# Patient Record
Sex: Female | Born: 1980 | Race: White | Hispanic: No | Marital: Married | Smoking: Former smoker
Health system: Southern US, Community
[De-identification: ages and names within clinical notes are randomized; demographics above are authoritative.]

## PROBLEM LIST (undated history)

## (undated) ENCOUNTER — Inpatient Hospital Stay: Payer: Self-pay

## (undated) DIAGNOSIS — N393 Stress incontinence (female) (male): Secondary | ICD-10-CM

## (undated) DIAGNOSIS — Z4689 Encounter for fitting and adjustment of other specified devices: Secondary | ICD-10-CM

## (undated) DIAGNOSIS — I10 Essential (primary) hypertension: Secondary | ICD-10-CM

## (undated) DIAGNOSIS — N8110 Cystocele, unspecified: Secondary | ICD-10-CM

## (undated) DIAGNOSIS — O1490 Unspecified pre-eclampsia, unspecified trimester: Secondary | ICD-10-CM

## (undated) HISTORY — DX: Encounter for fitting and adjustment of other specified devices: Z46.89

## (undated) HISTORY — PX: WISDOM TOOTH EXTRACTION: SHX21

## (undated) HISTORY — PX: FRACTURE SURGERY: SHX138

---
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
## 2005-12-17 ENCOUNTER — Observation Stay: Payer: Self-pay | Admitting: Obstetrics and Gynecology

## 2006-01-27 ENCOUNTER — Observation Stay: Payer: Self-pay | Admitting: Obstetrics and Gynecology

## 2006-02-04 ENCOUNTER — Observation Stay: Payer: Self-pay | Admitting: Obstetrics and Gynecology

## 2006-02-07 ENCOUNTER — Observation Stay: Payer: Self-pay | Admitting: Obstetrics and Gynecology

## 2006-02-10 ENCOUNTER — Observation Stay: Payer: Self-pay | Admitting: Obstetrics and Gynecology

## 2006-02-14 ENCOUNTER — Observation Stay: Payer: Self-pay | Admitting: Obstetrics and Gynecology

## 2006-02-17 ENCOUNTER — Observation Stay: Payer: Self-pay | Admitting: Obstetrics and Gynecology

## 2006-02-21 ENCOUNTER — Observation Stay: Payer: Self-pay | Admitting: Obstetrics and Gynecology

## 2006-02-23 ENCOUNTER — Inpatient Hospital Stay: Payer: Self-pay | Admitting: Obstetrics and Gynecology

## 2006-08-04 ENCOUNTER — Ambulatory Visit: Payer: Self-pay | Admitting: Allergy and Immunology

## 2008-01-19 ENCOUNTER — Emergency Department: Payer: Self-pay | Admitting: Emergency Medicine

## 2008-11-30 ENCOUNTER — Ambulatory Visit: Payer: Self-pay | Admitting: Family Medicine

## 2017-11-22 LAB — OB RESULTS CONSOLE ABO/RH: "RH Type ": POSITIVE

## 2017-12-06 NOTE — L&D Delivery Note (Signed)
       Delivery Note   Karen Lance BoschBrook Mosley is a 37 y.o. G2P0101 at 6278w2d Estimated Date of Delivery: 07/01/18  PRE-OPERATIVE DIAGNOSIS:  1) 4578w2d pregnancy.   POST-OPERATIVE DIAGNOSIS:  1) 7978w2d pregnancy s/p Vaginal, Spontaneous   Delivery Type: Vaginal, Spontaneous    Delivery Anesthesia: Epidural   Labor Complications:   None    ESTIMATED BLOOD LOSS: 50  ml    FINDINGS:   1) female infant, Apgar scores of 8    at 1 minute and 9    at 5 minutes and a birthweight of 98.77  ounces.    2) Nuchal cord: No  SPECIMENS:   PLACENTA:   Appearance: Intact    Removal: Spontaneous      Disposition:     DISPOSITION:  Infant to left in stable condition in the delivery room, with L&D personnel and mother,  NARRATIVE SUMMARY: Labor course:  [name] is a G2P0101 at 7278w2d who presented for induction of labor.  She progressed well in labor without pitocin.  She received the appropriate anesthesia and proceeded to complete dilation. She evidenced good maternal expulsive effort during the second stage. She went on to deliver a viable infant. The placenta delivered without problems and was noted to be complete. A perineal and vaginal examination was performed. Episiotomy/Lacerations: None    Elonda Huskyavid J. Evans, M.D. 06/12/2018 12:27 PM

## 2017-12-20 ENCOUNTER — Encounter: Payer: Self-pay | Admitting: Obstetrics and Gynecology

## 2017-12-20 ENCOUNTER — Emergency Department
Admission: EM | Admit: 2017-12-20 | Discharge: 2017-12-20 | Disposition: A | Discharge status: Home / Self Care | Payer: BC Managed Care – PPO | Attending: Student in an Organized Health Care Education/Training Program | Admitting: Student in an Organized Health Care Education/Training Program

## 2017-12-20 ENCOUNTER — Encounter: Payer: Self-pay | Admitting: *Deleted

## 2017-12-20 ENCOUNTER — Other Ambulatory Visit: Payer: Self-pay

## 2017-12-20 DIAGNOSIS — Z8759 Personal history of other complications of pregnancy, childbirth and the puerperium: Secondary | ICD-10-CM | POA: Diagnosis not present

## 2017-12-20 DIAGNOSIS — O132 Gestational [pregnancy-induced] hypertension without significant proteinuria, second trimester: Secondary | ICD-10-CM

## 2017-12-20 DIAGNOSIS — R55 Syncope and collapse: Secondary | ICD-10-CM | POA: Diagnosis not present

## 2017-12-20 DIAGNOSIS — Z3A13 13 weeks gestation of pregnancy: Secondary | ICD-10-CM | POA: Insufficient documentation

## 2017-12-20 DIAGNOSIS — O26892 Other specified pregnancy related conditions, second trimester: Secondary | ICD-10-CM | POA: Diagnosis not present

## 2017-12-20 HISTORY — DX: Unspecified pre-eclampsia, unspecified trimester: O14.90

## 2017-12-20 HISTORY — DX: Essential (primary) hypertension: I10

## 2017-12-20 LAB — BASIC METABOLIC PANEL
Anion gap: 9 (ref 5–15)
BUN: 17 mg/dL (ref 6–20)
CALCIUM: 9.1 mg/dL (ref 8.9–10.3)
CO2: 23 mmol/L (ref 22–32)
Chloride: 105 mmol/L (ref 101–111)
Creatinine, Ser: 0.66 mg/dL (ref 0.44–1.00)
GFR calc Af Amer: >60 mL/min (ref >60)
GLUCOSE: 96 mg/dL (ref 65–99)
Potassium: 4 mmol/L (ref 3.5–5.1)
Sodium: 137 mmol/L (ref 135–145)

## 2017-12-20 LAB — URINALYSIS, COMPLETE (UACMP) WITH MICROSCOPIC
Bacteria, UA: NONE SEEN
Bilirubin Urine: NEGATIVE
GLUCOSE, UA: NEGATIVE mg/dL
HGB URINE DIPSTICK: NEGATIVE
Ketones, ur: NEGATIVE mg/dL
Leukocytes, UA: NEGATIVE
Nitrite: NEGATIVE
Protein, ur: NEGATIVE mg/dL
RBC / HPF: NONE SEEN RBC/hpf (ref 0–5)
SPECIFIC GRAVITY, URINE: 1.02 (ref 1.005–1.030)
Squamous Epithelial / LPF: NONE SEEN
WBC, UA: NONE SEEN WBC/hpf (ref 0–5)
pH: 6 (ref 5.0–8.0)

## 2017-12-20 LAB — CBC
HCT: 35.6 % (ref 35.0–47.0)
Hemoglobin: 12.6 g/dL (ref 12.0–16.0)
MCH: 31.3 pg (ref 26.0–34.0)
MCHC: 35.5 g/dL (ref 32.0–36.0)
MCV: 88.2 fL (ref 80.0–100.0)
Platelets: 189 10*3/uL (ref 150–440)
RBC: 4.04 MIL/uL (ref 3.80–5.20)
RDW: 13.6 % (ref 11.5–14.5)
WBC: 8.3 10*3/uL (ref 3.6–11.0)

## 2017-12-20 LAB — HCG, QUANTITATIVE, PREGNANCY: HCG, BETA CHAIN, QUANT, S: 148227 m[IU]/mL — AB (ref <5)

## 2017-12-20 LAB — POCT PREGNANCY, URINE: Preg Test, Ur: POSITIVE — AB

## 2017-12-20 MED ORDER — NIFEDIPINE ER OSMOTIC RELEASE 30 MG PO TB24: 30.0000 mg | ORAL_TABLET | Freq: Every day | ORAL | 0 refills | Status: DC

## 2017-12-20 MED ORDER — NIFEDIPINE ER 30 MG PO TB24
30.0000 mg | ORAL_TABLET | Freq: Once | ORAL | Status: DC
  Filled 2017-12-20: qty 1

## 2017-12-20 NOTE — ED Triage Notes (Addendum)
Pt reports she had a syncopal; episode today while standing at the sink. Pt took vitals and reports they were WNL and went to work. PT reports while at work pt had a near syncopal episode again. Pt reports she has been feeling like her heart is racing intermittently. Pt denies feeling dizzy or symptomatic but reports headaches and feeling warm in her cheeks today. Pt is red in the cheeks.   Pt is [redacted] weeks pregnant. Pt reports with her first pregnancy she was preeclamptic. No troubles reported during this pregnancy. No vaginal bleeding or discharge. No abd cramping reporting.

## 2017-12-20 NOTE — ED Provider Notes (Signed)
[facility] Emergency Department Provider Note    First MD Initiated Contact with Patient 12/20/17 1958     (approximate)  I have reviewed the triage vital signs and the nursing notes.   HISTORY  Chief Complaint Loss of Consciousness    HPI Karen Mosley is a 37 y.o. female who is roughly [redacted] weeks pregnant G2P1 who presents with lightheadedness and near syncope that occurred while the patient was standing to get together today.  States that she started feeling lightheaded with blurry vision and fell but was able to catch her self.  Do not completely lose consciousness.  Denied any chest pain but has had palpitations.  Patient checked her blood pressure and it was elevated to the 140s twice today.  Has a history of preeclampsia so she went to be evaluated.  Denies any vaginal spotting.  No abdominal pain.  No chest pain or shortness of breath.  No lower extremity swelling.  Past Medical History:  Diagnosis Date  . Hypertension   . Preeclampsia    History reviewed. No pertinent family history. History reviewed. No pertinent surgical history. There are no active problems to display for this patient.     Prior to Admission medications   Medication Sig Start Date End Date Taking? Authorizing Provider  NIFEdipine (PROCARDIA-XL/ADALAT-CC/NIFEDICAL-XL) 30 MG 24 hr tablet Take 1 tablet (30 mg total) by mouth daily. 12/20/17 12/20/18  Willy Eddy, MD    Allergies Lobster [shellfish allergy]; Benadryl allergy [diphenhydramine hcl]; and Red dye    Social History Social History   Tobacco Use  . Smoking status: Never Smoker  . Smokeless tobacco: Never Used  Substance Use Topics  . Alcohol use: No    Frequency: Never  . Drug use: Not on file    Review of Systems Patient denies headaches, rhinorrhea, blurry vision, numbness, shortness of breath, chest pain, edema, cough, abdominal pain, nausea, vomiting, diarrhea, dysuria, fevers, rashes or  hallucinations unless otherwise stated above in HPI. ____________________________________________   PHYSICAL EXAM:  VITAL SIGNS: Vitals:   12/20/17 1658 12/20/17 2121  BP: (!) 141/78 109/70  Pulse: 76 69  Resp: 16 18  Temp: 98.4 F (36.9 C)   SpO2: 100% 100%    Constitutional: Alert and oriented. Well appearing and in no acute distress. Eyes: Conjunctivae are normal.  Head: Atraumatic. Nose: No congestion/rhinnorhea. Mouth/Throat: Mucous membranes are moist.   Neck: No stridor. Painless ROM.  Cardiovascular: Normal rate, regular rhythm. Grossly normal heart sounds.  Good peripheral circulation. Respiratory: Normal respiratory effort.  No retractions. Lungs CTAB. Gastrointestinal: Soft and nontender. No distention. No abdominal bruits. No CVA tenderness. Genitourinary:  Musculoskeletal: No lower extremity tenderness nor edema.  No joint effusions. Neurologic:  Normal speech and language. No gross focal neurologic deficits are appreciated. No facial droop Skin:  Skin is warm, dry and intact. No rash noted. Psychiatric: Mood and affect are normal. Speech and behavior are normal.  ____________________________________________   LABS (all labs ordered are listed, but only abnormal results are displayed)  Results for orders placed or performed during the hospital encounter of 12/20/17 (from the past 24 hour(s))  Basic metabolic panel     Status: None   Collection Time: 12/20/17  5:00 PM  Result Value Ref Range   Sodium 137 135 - 145 mmol/L   Potassium 4.0 3.5 - 5.1 mmol/L   Chloride 105 101 - 111 mmol/L   CO2 23 22 - 32 mmol/L   Glucose, Bld 96 65 - 99  mg/dL   BUN 17 6 - 20 mg/dL   Creatinine, Ser 1.610.66 0.44 - 1.00 mg/dL   Calcium 9.1 8.9 - 09.610.3 mg/dL   GFR calc non Af Amer >60 >60 mL/min   GFR calc Af Amer >60 >60 mL/min   Anion gap 9 5 - 15  CBC     Status: None   Collection Time: 12/20/17  5:00 PM  Result Value Ref Range   WBC 8.3 3.6 - 11.0 K/uL   RBC 4.04 3.80 -  5.20 MIL/uL   Hemoglobin 12.6 12.0 - 16.0 g/dL   HCT 04.535.6 40.935.0 - 81.147.0 %   MCV 88.2 80.0 - 100.0 fL   MCH 31.3 26.0 - 34.0 pg   MCHC 35.5 32.0 - 36.0 g/dL   RDW 91.413.6 78.211.5 - 95.614.5 %   Platelets 189 150 - 440 K/uL  hCG, quantitative, pregnancy     Status: Abnormal   Collection Time: 12/20/17  5:02 PM  Result Value Ref Range   hCG, Beta Chain, Quant, S 148,227 (H) <5 mIU/mL  Urinalysis, Complete w Microscopic     Status: Abnormal   Collection Time: 12/20/17  7:42 PM  Result Value Ref Range   Color, Urine YELLOW (A) YELLOW   APPearance TURBID (A) CLEAR   Specific Gravity, Urine 1.020 1.005 - 1.030   pH 6.0 5.0 - 8.0   Glucose, UA NEGATIVE NEGATIVE mg/dL   Hgb urine dipstick NEGATIVE NEGATIVE   Bilirubin Urine NEGATIVE NEGATIVE   Ketones, ur NEGATIVE NEGATIVE mg/dL   Protein, ur NEGATIVE NEGATIVE mg/dL   Nitrite NEGATIVE NEGATIVE   Leukocytes, UA NEGATIVE NEGATIVE   RBC / HPF NONE SEEN 0 - 5 RBC/hpf   WBC, UA NONE SEEN 0 - 5 WBC/hpf   Bacteria, UA NONE SEEN NONE SEEN   Squamous Epithelial / LPF NONE SEEN NONE SEEN   Amorphous Karen Mosley PRESENT   Pregnancy, urine POC     Status: Abnormal   Collection Time: 12/20/17  7:51 PM  Result Value Ref Range   Preg Test, Ur POSITIVE (A) NEGATIVE   ____________________________________________  EKG My review and personal interpretation at Time: 16:59   Indication: near syncope  Rate: 75  Rhythm: sinus Axis: normal Other: normal intervals, no stemi, no brugada ____________________________________________  RADIOLOGY  I personally reviewed all radiographic images ordered to evaluate for the above acute complaints and reviewed radiology reports and findings.  These findings were personally discussed with the patient.  Please see medical record for radiology report.  EMERGENCY DEPARTMENT US PREGNANCY "Study: Limited Ultrasound of the Pelvis for Pregnancy"  INDICATIONS:Pregnancy(required) Multiple views of the uterus and pelvic cavity were  obtained in real-time with a multi-frequency probe.  APPROACH:Transabdominal  PERFORMED BY: Myself IMAGES ARCHIVED?: Yes LIMITATIONS: none PREGNANCY FREE FLUID: Present ADNEXAL FINDINGS: GESTATIONAL AGE, ESTIMATE: 13 FETAL HEART RATE: 158 INTERPRETATION: reassuring fetal movement     ____________________________________________   PROCEDURES  Procedure(s) performed:  Procedures    Critical Care performed: no ____________________________________________   INITIAL IMPRESSION / ASSESSMENT AND PLAN / ED COURSE  Pertinent labs & imaging results that were available during my care of the patient were reviewed by me and considered in my medical decision making (see chart for details).  DDX: Dysrhythmia, lightheadedness, orthostasis, UTI, ectopic, anemia  Karen Mosley is a 37 y.o. who presents to the ED with elevated blood pressure and above symptoms.  She is afebrile mildly hypertensive.  No proteinuria.  No evidence of UTI.  Abdominal exam soft and benign  with reassuring bedside ultrasound and fetal heart tones.  EKG shows no dysrhythmia.  Patient otherwise back to baseline with no symptoms.  No evidence of head injury.  I spoke with Dr. Gaynelle Arabian of OB/GYN regarding the patient's elevated blood pressure and history of preeclampsia she has recommended initiating Procardia 30 mg as well as a baby aspirin at night to reduce risk of preeclampsia.  Patient will follow-up with OB/GYN.  Have discussed with the patient and available family all diagnostics and treatments performed thus far and all questions were answered to the best of my ability. The patient demonstrates understanding and agreement with plan.       ____________________________________________   FINAL CLINICAL IMPRESSION(S) / ED DIAGNOSES  Final diagnoses:  Near syncope  Gestational hypertension, second trimester      NEW MEDICATIONS STARTED DURING THIS VISIT:  Discharge Medication List as of 12/20/2017  9:18  PM    START taking these medications   Details  NIFEdipine (PROCARDIA-XL/ADALAT-CC/NIFEDICAL-XL) 30 MG 24 hr tablet Take 1 tablet (30 mg total) by mouth daily., Starting Tue 12/20/2017, Until Wed 12/20/2018, Print         Note:  This document was prepared using Dragon voice recognition software and may include unintentional dictation errors.    Willy Eddy, MD 12/21/17 [phone]

## 2017-12-20 NOTE — ED Notes (Signed)
UNABLE TO HEAR FETAL HEART TONES IN TRIAGE 

## 2018-01-04 ENCOUNTER — Telehealth: Payer: Self-pay | Admitting: *Deleted

## 2018-01-04 NOTE — Telephone Encounter (Signed)
Patient called and states that she is coming to see Dr. Valentino Saxonherry for a NEW OB appt on 01/11/18 . Patient is wanting a RX sent to her pharmacy for folic acid . Patient states she takes 4mg  of that. Patient is unsure if Dr. Valentino Saxonherry could send that for her. [facility] [place]. Please advise. Thank you

## 2018-01-06 ENCOUNTER — Encounter: Payer: Self-pay | Admitting: Obstetrics and Gynecology

## 2018-01-11 ENCOUNTER — Encounter: Payer: Self-pay | Admitting: Obstetrics and Gynecology

## 2018-01-11 ENCOUNTER — Ambulatory Visit (INDEPENDENT_AMBULATORY_CARE_PROVIDER_SITE_OTHER): Payer: BC Managed Care – PPO | Admitting: Obstetrics and Gynecology

## 2018-01-11 VITALS — BP 108/72 | HR 85 | Wt 137.9 lb

## 2018-01-11 DIAGNOSIS — O10912 Unspecified pre-existing hypertension complicating pregnancy, second trimester: Secondary | ICD-10-CM

## 2018-01-11 DIAGNOSIS — Z1389 Encounter for screening for other disorder: Secondary | ICD-10-CM

## 2018-01-11 DIAGNOSIS — O10913 Unspecified pre-existing hypertension complicating pregnancy, third trimester: Secondary | ICD-10-CM | POA: Insufficient documentation

## 2018-01-11 DIAGNOSIS — O0992 Supervision of high risk pregnancy, unspecified, second trimester: Secondary | ICD-10-CM

## 2018-01-11 DIAGNOSIS — O0993 Supervision of high risk pregnancy, unspecified, third trimester: Secondary | ICD-10-CM | POA: Insufficient documentation

## 2018-01-11 DIAGNOSIS — O09292 Supervision of pregnancy with other poor reproductive or obstetric history, second trimester: Secondary | ICD-10-CM

## 2018-01-11 DIAGNOSIS — Z1379 Encounter for other screening for genetic and chromosomal anomalies: Secondary | ICD-10-CM

## 2018-01-11 DIAGNOSIS — Z113 Encounter for screening for infections with a predominantly sexual mode of transmission: Secondary | ICD-10-CM

## 2018-01-11 DIAGNOSIS — O09522 Supervision of elderly multigravida, second trimester: Secondary | ICD-10-CM

## 2018-01-11 LAB — OB RESULTS CONSOLE VARICELLA ZOSTER ANTIBODY, IGG: Varicella: IMMUNE

## 2018-01-11 MED ORDER — ASPIRIN EC 81 MG PO TBEC: 81.0000 mg | DELAYED_RELEASE_TABLET | Freq: Every day | ORAL | 2 refills | Status: DC

## 2018-01-11 NOTE — Progress Notes (Signed)
OBSTETRIC INITIAL PRENATAL VISIT  Subjective:    Karen Mosley is being seen today for her first obstetrical visit.  [facility]. This is an unplanned but desired pregnancy. She is a G1P0 female at 7169w4d gestation, Estimated Date of Delivery: 07/01/18 by 8 week sono, LMP unknown. Her obstetrical history is significant for pre-eclampsia in prior pregnancy, now with cHTN, and preterm delivery. Relationship with FOB: spouse, living together. Patient does intend to breast feed. Pregnancy history fully reviewed.   Obstetric History   G2   P1   T0   P1   A0   L1    SAB0   TAB0   Ectopic0   Multiple0   Live Births1     # Outcome Date GA Lbr Len/2nd Weight Sex Delivery Anes PTL Lv  2 Current           1 Preterm 2006 1666w5d  6 lb (2.722 kg) M Vag-Vacuum  N LIV    Obstetric Comments  G1- pre-eclampsia, induced at ~ 34 weeks, with postpartum HTN, now chronic. Also with nuchal cord, vacuum assistance due to fetal distress. .     Gynecologic History:  Last pap smear was 2 years ago .  Results were normal.  Denies h/o abnormal pap smears in the past.  Denies history of STIs.    Past Medical History:  Diagnosis Date  . Hypertension   . Preeclampsia      Family History  Problem Relation Age of Onset  . Hypertension Mother   . Hypertension Father   . Hypertension Maternal Aunt   . Stroke Maternal Grandmother   . Breast cancer Maternal Grandmother        with recurrence  . Hypertension Maternal Grandmother      History reviewed. No pertinent surgical history.   Social History   Socioeconomic History  . Marital status: Married    Spouse name: Not on file  . Number of children: Not on file  . Years of education: Not on file  . Highest education level: Not on file  Social Needs  . Financial resource strain: Not on file  . Food insecurity - worry: Not on file  . Food insecurity - inability: Not on file  . Transportation needs - medical: Not on  file  . Transportation needs - non-medical: Not on file  Occupational History  . Not on file  Tobacco Use  . Smoking status: Never Smoker  . Smokeless tobacco: Never Used  Substance and Sexual Activity  . Alcohol use: No    Frequency: Never  . Drug use: Not on file  . Sexual activity: Yes  Other Topics Concern  . Not on file  Social History Narrative  . Not on file     Current Outpatient Medications on File Prior to Visit  Medication Sig Dispense Refill  . NIFEdipine (PROCARDIA-XL/ADALAT-CC/NIFEDICAL-XL) 30 MG 24 hr tablet Take 1 tablet (30 mg total) by mouth daily. 30 tablet 0  . Prenatal Vit-Fe Fumarate-FA (MULTIVITAMIN-PRENATAL) 27-0.8 MG TABS tablet Take 1 tablet by mouth daily at 12 noon.     No current facility-administered medications on file prior to visit.      Allergies  Allergen Reactions  . Lobster [Shellfish Allergy] Anaphylaxis  . Benadryl Allergy [Diphenhydramine Hcl] Swelling  . Red Dye Swelling     Review of Systems General:Not Present- Fever, Weight Loss and Weight Gain. Skin:Not Present- Rash. HEENT:Not Present- Blurred Vision, Headache and Bleeding Gums. Respiratory:Not Present-  Difficulty Breathing. Breast:Not Present- Breast Mass. Cardiovascular:Not Present- Chest Pain, Elevated Blood Pressure, Fainting / Blacking Out and Shortness of Breath. Gastrointestinal:Not Present- Abdominal Pain, Constipation, Nausea and Vomiting. Female Genitourinary:Not Present- Frequency, Painful Urination, Pelvic Pain, Vaginal Bleeding, Vaginal Discharge, Contractions, regular, Fetal Movements Decreased, Urinary Complaints and Vaginal Fluid. Musculoskeletal:Not Present- Back Pain and Leg Cramps. Neurological:Not Present- Dizziness. Psychiatric:Not Present- Depression.     Objective:   Blood pressure 108/72, pulse 85, weight 137 lb 14.4 oz (62.6 kg).  Body mass index is 25.22 kg/m.   General Appearance:    Alert, cooperative, no distress, appears  stated age  Head:    Normocephalic, without obvious abnormality, atraumatic  Eyes:    PERRL, conjunctiva/corneas clear, EOM's intact, both eyes  Ears:    Normal external ear canals, both ears  Nose:   Nares normal, septum midline, mucosa normal, no drainage or sinus tenderness  Throat:   Lips, mucosa, and tongue normal; teeth and gums normal  Neck:   Supple, symmetrical, trachea midline, no adenopathy; thyroid: no enlargement/tenderness/nodules; no carotid bruit or JVD  Back:     Symmetric, no curvature, ROM normal, no CVA tenderness  Lungs:     Clear to auscultation bilaterally, respirations unlabored  Chest Wall:    No tenderness or deformity   Heart:    Regular rate and rhythm, S1 and S2 normal, no murmur, rub or gallop  Breast Exam:    No tenderness, masses, or nipple abnormality  Abdomen:     Soft, non-tender, bowel sounds active all four quadrants, no masses, no organomegaly.  FH 15.  FHT 153 bpm.  Genitalia:    Pelvic:external genitalia normal, vagina without lesions, discharge, or tenderness, rectovaginal septum  normal. Cervix normal in appearance, no cervical motion tenderness, no adnexal masses or tenderness.  Pregnancy positive findings: uterine enlargement: 15 wk size, nontender.   Rectal:    Normal external sphincter.  No hemorrhoids appreciated. Internal exam not done.   Extremities:   Extremities normal, atraumatic, no cyanosis or edema  Pulses:   2+ and symmetric all extremities  Skin:   Skin color, texture, turgor normal, no rashes or lesions  Lymph nodes:   Cervical, supraclavicular, and axillary nodes normal  Neurologic:   CNII-XII intact, normal strength, sensation and reflexes throughout      Assessment:   Pregnancy at 15 and 4/7 weeks   Advanced maternal age H/o pre-eclampsia H/o preterm delivery x 1   Plan:    Initial labs ordered. Prenatal vitamins encouraged. Problem list reviewed and updated. New OB counseling:  The patient has been given an overview  regarding routine prenatal care.  Recommendations regarding diet, weight gain, and exercise in pregnancy were given. Prenatal testing, optional genetic testing, and ultrasound use in pregnancy were reviewed.  Patient initially declined genetic testing at [facility] office.  Discussed benefits of genetic testing, especially in light of AMA status. Patient has now reconsidered and will do Panorama. Also ordered msAFP.  Benefits of Breast Feeding were discussed. The patient is encouraged to consider nursing her baby post partum. CHTN - previously on [place] prior to pregnancy, changed to Procardia in January during a visit ER. Can continue current dosing. Will monitor BPs during pregnancy. Advised on beginning baby aspirin.  Also discussed need for antenatal testing and serial growth scans during third trimester. CBC/CMP reviewed from ER visit. Baseline PC ratio ordered.  H/o preterm delivery x 1, induction for pre-eclampsia. Not a candidate for 17-OHP.  Declines flu vaccine.  Follow up in  4 weeks.  For anatomy scan at that time.    50% of 30 min visit spent on counseling and coordination of care.   Hildred Laser, MD [facility]

## 2018-01-12 LAB — DRUG PROFILE, UR, 9 DRUGS (LABCORP)
Amphetamines, Urine: NEGATIVE ng/mL
BENZODIAZEPINE QUANT UR: NEGATIVE ng/mL
Barbiturate Quant, Ur: NEGATIVE ng/mL
CANNABINOID QUANT UR: NEGATIVE ng/mL
Cocaine (Metab.): NEGATIVE ng/mL
Methadone Screen, Urine: NEGATIVE ng/mL
Opiate Quant, Ur: NEGATIVE ng/mL
PCP QUANT UR: NEGATIVE ng/mL
Propoxyphene: NEGATIVE ng/mL

## 2018-01-12 LAB — URINALYSIS, ROUTINE W REFLEX MICROSCOPIC
Bilirubin, UA: NEGATIVE
GLUCOSE, UA: NEGATIVE
Ketones, UA: NEGATIVE
LEUKOCYTES UA: NEGATIVE
Nitrite, UA: NEGATIVE
PROTEIN UA: NEGATIVE
RBC, UA: NEGATIVE
Specific Gravity, UA: 1.018 (ref 1.005–1.030)
UUROB: 0.2 mg/dL (ref 0.2–1.0)
pH, UA: 6.5 (ref 5.0–7.5)

## 2018-01-12 LAB — RPR: RPR: NONREACTIVE

## 2018-01-12 LAB — CBC WITH DIFFERENTIAL/PLATELET
BASOS: 0 %
Basophils Absolute: 0 10*3/uL (ref 0.0–0.2)
EOS (ABSOLUTE): 0.1 10*3/uL (ref 0.0–0.4)
Eos: 1 %
Hematocrit: 35.9 % (ref 34.0–46.6)
Hemoglobin: 12.5 g/dL (ref 11.1–15.9)
IMMATURE GRANS (ABS): 0.1 10*3/uL (ref 0.0–0.1)
IMMATURE GRANULOCYTES: 1 %
LYMPHS: 23 %
Lymphocytes Absolute: 2 10*3/uL (ref 0.7–3.1)
MCH: 30.4 pg (ref 26.6–33.0)
MCHC: 34.8 g/dL (ref 31.5–35.7)
MCV: 87 fL (ref 79–97)
Monocytes Absolute: 0.5 10*3/uL (ref 0.1–0.9)
Monocytes: 6 %
NEUTROS PCT: 69 %
Neutrophils Absolute: 5.9 10*3/uL (ref 1.4–7.0)
PLATELETS: 213 10*3/uL (ref 150–379)
RBC: 4.11 x10E6/uL (ref 3.77–5.28)
RDW: 13.7 % (ref 12.3–15.4)
WBC: 8.6 10*3/uL (ref 3.4–10.8)

## 2018-01-12 LAB — ANTIBODY SCREEN: Antibody Screen: NEGATIVE

## 2018-01-12 LAB — HIV ANTIBODY (ROUTINE TESTING W REFLEX): HIV Screen 4th Generation wRfx: NONREACTIVE

## 2018-01-12 LAB — NICOTINE SCREEN, URINE: COTININE UR QL SCN: NEGATIVE ng/mL

## 2018-01-12 LAB — RUBELLA SCREEN: RUBELLA: 1.52 {index} (ref >0.99)

## 2018-01-12 LAB — VARICELLA ZOSTER ANTIBODY, IGG: VARICELLA: 851 {index} (ref >165)

## 2018-01-13 LAB — GC/CHLAMYDIA PROBE AMP
Chlamydia trachomatis, NAA: NEGATIVE
NEISSERIA GONORRHOEAE BY PCR: NEGATIVE

## 2018-01-13 LAB — CULTURE, OB URINE

## 2018-01-13 LAB — URINE CULTURE, OB REFLEX

## 2018-01-20 ENCOUNTER — Telehealth: Payer: Self-pay | Admitting: Obstetrics and Gynecology

## 2018-01-20 NOTE — Telephone Encounter (Signed)
Patient called stating she needs a refill on her bloood pressure meds sent to the [place] pharmacy. Thanks

## 2018-01-23 MED ORDER — NIFEDIPINE ER OSMOTIC RELEASE 30 MG PO TB24: 30.0000 mg | ORAL_TABLET | Freq: Every day | ORAL | 0 refills | Status: DC

## 2018-01-23 NOTE — Telephone Encounter (Signed)
Pt was called and informed that her medication Procardia had been refilled.

## 2018-01-24 ENCOUNTER — Telehealth: Payer: Self-pay | Admitting: *Deleted

## 2018-01-24 NOTE — Telephone Encounter (Signed)
Patient called and states she had genetic testing done on 01/11/18 and she is wondering if the results are back. Patient is requesting a call back. Her contact # is [phone]. Please advise. Thank you

## 2018-01-26 NOTE — Telephone Encounter (Signed)
Pt was called back no answer. Left message via voicemail that her genetic testing was still being processed and as soon as it arrived I would give her a call back and go over the results.

## 2018-01-31 ENCOUNTER — Telehealth: Payer: Self-pay | Admitting: Obstetrics and Gynecology

## 2018-01-31 NOTE — Telephone Encounter (Signed)
-----   Message from Hildred LaserAnika Cherry, MD sent at 01/26/2018  2:39 PM EST ----- Regarding: Panorama results Please inform patient of normal genetic testing. Is a female if she desires to know the sex.

## 2018-01-31 NOTE — Telephone Encounter (Signed)
Pt is aware of her results.  

## 2018-01-31 NOTE — Telephone Encounter (Signed)
Pt was left message via voicemail to call the office to get her results.

## 2018-01-31 NOTE — Telephone Encounter (Signed)
The patient called and stated that she would like to have her nurse call her back to go over her genetic test results. Please advise.

## 2018-02-08 ENCOUNTER — Ambulatory Visit (INDEPENDENT_AMBULATORY_CARE_PROVIDER_SITE_OTHER): Payer: BC Managed Care – PPO | Admitting: Obstetrics and Gynecology

## 2018-02-08 ENCOUNTER — Ambulatory Visit (INDEPENDENT_AMBULATORY_CARE_PROVIDER_SITE_OTHER): Payer: BC Managed Care – PPO

## 2018-02-08 ENCOUNTER — Encounter: Payer: Self-pay | Admitting: Obstetrics and Gynecology

## 2018-02-08 VITALS — BP 117/76 | HR 80 | Wt 142.9 lb

## 2018-02-08 DIAGNOSIS — Z3492 Encounter for supervision of normal pregnancy, unspecified, second trimester: Secondary | ICD-10-CM | POA: Diagnosis not present

## 2018-02-08 DIAGNOSIS — O09522 Supervision of elderly multigravida, second trimester: Secondary | ICD-10-CM

## 2018-02-08 DIAGNOSIS — O09292 Supervision of pregnancy with other poor reproductive or obstetric history, second trimester: Secondary | ICD-10-CM

## 2018-02-08 DIAGNOSIS — O0992 Supervision of high risk pregnancy, unspecified, second trimester: Secondary | ICD-10-CM | POA: Diagnosis not present

## 2018-02-08 LAB — POCT URINALYSIS DIPSTICK
BILIRUBIN UA: NEGATIVE
GLUCOSE UA: NEGATIVE
KETONES UA: NEGATIVE
Leukocytes, UA: NEGATIVE
Nitrite, UA: NEGATIVE
Protein, UA: NEGATIVE
RBC UA: NEGATIVE
SPEC GRAV UA: 1.015 (ref 1.010–1.025)
Urobilinogen, UA: 0.2 E.U./dL
pH, UA: 6 (ref 5.0–8.0)

## 2018-02-08 NOTE — Progress Notes (Signed)
ROB- anatomy scan done today, pt is doing well 

## 2018-02-08 NOTE — Progress Notes (Signed)
ROB:  U/S today - Nl anatomy.  Taking 81 mg aspirin daily.  Taking Procardia daily.  Desires AFP for spina bifida.

## 2018-02-09 LAB — PROTEIN / CREATININE RATIO, URINE
Creatinine, Urine: 104.6 mg/dL
PROTEIN/CREAT RATIO: 76 mg/g{creat} (ref 0–200)
Protein, Ur: 8 mg/dL

## 2018-02-10 LAB — AFP, SERUM, OPEN SPINA BIFIDA
AFP MOM: 1.36
AFP VALUE AFPOSL: 68.4 ng/mL
Gest. Age on Collection Date: 19 weeks
MATERNAL AGE AT EDD: 35.6 a
OSBR Risk 1 IN: 4034
Test Results:: NEGATIVE
Weight: 142 [lb_av]

## 2018-03-08 ENCOUNTER — Ambulatory Visit (INDEPENDENT_AMBULATORY_CARE_PROVIDER_SITE_OTHER): Payer: BC Managed Care – PPO | Admitting: Obstetrics and Gynecology

## 2018-03-08 ENCOUNTER — Encounter: Payer: Self-pay | Admitting: Obstetrics and Gynecology

## 2018-03-08 VITALS — BP 131/83 | HR 95 | Wt 151.4 lb

## 2018-03-08 DIAGNOSIS — Z131 Encounter for screening for diabetes mellitus: Secondary | ICD-10-CM

## 2018-03-08 DIAGNOSIS — O0992 Supervision of high risk pregnancy, unspecified, second trimester: Secondary | ICD-10-CM

## 2018-03-08 DIAGNOSIS — O10919 Unspecified pre-existing hypertension complicating pregnancy, unspecified trimester: Secondary | ICD-10-CM

## 2018-03-08 DIAGNOSIS — Z8759 Personal history of other complications of pregnancy, childbirth and the puerperium: Secondary | ICD-10-CM

## 2018-03-08 DIAGNOSIS — Z113 Encounter for screening for infections with a predominantly sexual mode of transmission: Secondary | ICD-10-CM

## 2018-03-08 DIAGNOSIS — Z130 Encounter for screening for diseases of the blood and blood-forming organs and certain disorders involving the immune mechanism: Secondary | ICD-10-CM

## 2018-03-08 LAB — POCT URINALYSIS DIPSTICK
BILIRUBIN UA: NEGATIVE
Blood, UA: NEGATIVE
GLUCOSE UA: NEGATIVE
KETONES UA: 5
LEUKOCYTES UA: NEGATIVE
Nitrite, UA: NEGATIVE
Spec Grav, UA: 1.02 (ref 1.010–1.025)
Urobilinogen, UA: 0.2 E.U./dL
pH, UA: 6.5 (ref 5.0–8.0)

## 2018-03-08 NOTE — Progress Notes (Signed)
ROB: Patient doing well, no complaints. RTC in 4 weeks. Will need 28 week labs then, and growth scan for cHTN in pregnancy. Discussion had regarding need for antenatal testing and serial growth scans at 32 weeks.  Will continue to monitor closely for s/s of pre-eclampsia as last pregnancy was complicated, and required early delivery at 34 weeks.

## 2018-03-08 NOTE — Progress Notes (Signed)
ROB pt is doing well no concerns.  

## 2018-04-11 ENCOUNTER — Encounter: Payer: Self-pay | Admitting: Obstetrics and Gynecology

## 2018-04-11 ENCOUNTER — Ambulatory Visit (INDEPENDENT_AMBULATORY_CARE_PROVIDER_SITE_OTHER): Payer: BC Managed Care – PPO | Admitting: Obstetrics and Gynecology

## 2018-04-11 ENCOUNTER — Encounter: Payer: BC Managed Care – PPO | Admitting: Obstetrics and Gynecology

## 2018-04-11 ENCOUNTER — Ambulatory Visit: Payer: BC Managed Care – PPO

## 2018-04-11 VITALS — BP 142/87 | HR 94 | Wt 153.3 lb

## 2018-04-11 DIAGNOSIS — O0993 Supervision of high risk pregnancy, unspecified, third trimester: Secondary | ICD-10-CM | POA: Diagnosis not present

## 2018-04-11 DIAGNOSIS — Z23 Encounter for immunization: Secondary | ICD-10-CM

## 2018-04-11 DIAGNOSIS — O0992 Supervision of high risk pregnancy, unspecified, second trimester: Secondary | ICD-10-CM

## 2018-04-11 DIAGNOSIS — O10919 Unspecified pre-existing hypertension complicating pregnancy, unspecified trimester: Secondary | ICD-10-CM

## 2018-04-11 LAB — POCT URINALYSIS DIPSTICK
Bilirubin, UA: NEGATIVE
Blood, UA: NEGATIVE
CLARITY UA: NEGATIVE
GLUCOSE UA: NEGATIVE
KETONES UA: NEGATIVE
Leukocytes, UA: NEGATIVE
Nitrite, UA: NEGATIVE
ODOR: NEGATIVE
SPEC GRAV UA: 1.015 (ref 1.010–1.025)
Urobilinogen, UA: 0.2 E.U./dL
pH, UA: 6.5 (ref 5.0–8.0)

## 2018-04-11 MED ORDER — TETANUS-DIPHTH-ACELL PERTUSSIS 5-2.5-18.5 LF-MCG/0.5 IM SUSP
0.5000 mL | Freq: Once | INTRAMUSCULAR | Status: AC
  Administered 2018-04-11: 0.5 mL via INTRAMUSCULAR

## 2018-04-11 NOTE — Progress Notes (Signed)
ROB: Hypertension slightly worse.  Patient taking 30 of Procardia.  Increased to 30 twice daily.  Patient will need to reschedule ultrasound and 1 hour GCT.  Recheck blood pressure in 2 weeks and should have results from ultrasound and GCT at that time.  Discussed Carpal tunnel syndrome in detail-advised use of neutral wrist splints.

## 2018-04-11 NOTE — Progress Notes (Signed)
ROB - tdap- done. Hands and fingers go numb at bedtime. Needs to r/s gct and growth scan.

## 2018-04-15 ENCOUNTER — Other Ambulatory Visit: Payer: Self-pay | Admitting: Obstetrics and Gynecology

## 2018-04-21 ENCOUNTER — Telehealth: Payer: Self-pay | Admitting: *Deleted

## 2018-04-21 NOTE — Telephone Encounter (Signed)
Patient called and stats she thinks she is having side effect from her blood pressure medication. Patient states she discussed this at her last appt. Dr states it may be carpal tunnel. Patient states the pain and numbness is now progressing to her shoulders.  She states she would like to change medications. Patient is requesting a call back. Her contact # is [phone]. Please advise. Thank you

## 2018-04-21 NOTE — Telephone Encounter (Signed)
lmtrc

## 2018-04-25 ENCOUNTER — Other Ambulatory Visit: Payer: Self-pay | Admitting: Obstetrics and Gynecology

## 2018-04-25 DIAGNOSIS — O163 Unspecified maternal hypertension, third trimester: Secondary | ICD-10-CM

## 2018-04-25 NOTE — Telephone Encounter (Signed)
lmtrc

## 2018-04-26 ENCOUNTER — Encounter: Payer: Self-pay | Admitting: Obstetrics and Gynecology

## 2018-04-26 ENCOUNTER — Ambulatory Visit (INDEPENDENT_AMBULATORY_CARE_PROVIDER_SITE_OTHER): Payer: BC Managed Care – PPO | Admitting: Obstetrics and Gynecology

## 2018-04-26 VITALS — BP 115/75 | HR 87 | Wt 156.0 lb

## 2018-04-26 DIAGNOSIS — O26899 Other specified pregnancy related conditions, unspecified trimester: Secondary | ICD-10-CM

## 2018-04-26 DIAGNOSIS — O0993 Supervision of high risk pregnancy, unspecified, third trimester: Secondary | ICD-10-CM

## 2018-04-26 DIAGNOSIS — O10913 Unspecified pre-existing hypertension complicating pregnancy, third trimester: Secondary | ICD-10-CM

## 2018-04-26 DIAGNOSIS — G5600 Carpal tunnel syndrome, unspecified upper limb: Secondary | ICD-10-CM

## 2018-04-26 DIAGNOSIS — O09893 Supervision of other high risk pregnancies, third trimester: Secondary | ICD-10-CM

## 2018-04-26 DIAGNOSIS — O09213 Supervision of pregnancy with history of pre-term labor, third trimester: Secondary | ICD-10-CM

## 2018-04-26 LAB — POCT URINALYSIS DIPSTICK
BILIRUBIN UA: NEGATIVE
Glucose, UA: NEGATIVE
Ketones, UA: NEGATIVE
Leukocytes, UA: NEGATIVE
NITRITE UA: NEGATIVE
Protein, UA: POSITIVE — AB
RBC UA: NEGATIVE
Spec Grav, UA: 1.015 (ref 1.010–1.025)
UROBILINOGEN UA: 0.2 U/dL
pH, UA: 6.5 (ref 5.0–8.0)

## 2018-04-26 NOTE — Progress Notes (Signed)
ROB: Patient continues to complain of carpal tunnel symptoms. Has tried ice, position changes, and old neural wrist splints. Wonders if it's also related to the Procardia (read side effects online). Discussed that it is possible, however it is more beneficial to remain on Procardia at this time for her HTN.  For glucola and growth scan tomorrow. RTC in 2 weeks. To begin twice weekly NSTs at 32 weeks.

## 2018-04-26 NOTE — Progress Notes (Signed)
ROB-pt stated that she has been having some pressure in the abd/pelvic area but not pain. Pt stated that her hands are going numb and having pain. Pt stated she is unable to hold something, burning herself,

## 2018-04-27 ENCOUNTER — Ambulatory Visit (INDEPENDENT_AMBULATORY_CARE_PROVIDER_SITE_OTHER): Payer: BC Managed Care – PPO

## 2018-04-27 ENCOUNTER — Ambulatory Visit: Payer: BC Managed Care – PPO

## 2018-04-27 DIAGNOSIS — O163 Unspecified maternal hypertension, third trimester: Secondary | ICD-10-CM

## 2018-04-27 NOTE — Telephone Encounter (Signed)
Seen by [facility] 04/26/2018.  Chart filed.

## 2018-04-27 NOTE — Addendum Note (Signed)
Addended by: Smith Mince on: 04/27/2018 09:44 AM   Modules accepted: Orders

## 2018-04-28 LAB — CBC
Hematocrit: 34.2 % (ref 34.0–46.6)
Hemoglobin: 11.7 g/dL (ref 11.1–15.9)
MCH: 29.3 pg (ref 26.6–33.0)
MCHC: 34.2 g/dL (ref 31.5–35.7)
MCV: 86 fL (ref 79–97)
Platelets: 187 10*3/uL (ref 150–450)
RBC: 3.99 x10E6/uL (ref 3.77–5.28)
RDW: 13.9 % (ref 12.3–15.4)
WBC: 7.8 10*3/uL (ref 3.4–10.8)

## 2018-04-28 LAB — GLUCOSE, 1 HOUR GESTATIONAL: Gestational Diabetes Screen: 78 mg/dL (ref 65–139)

## 2018-04-28 LAB — RPR: RPR: NONREACTIVE

## 2018-05-12 ENCOUNTER — Observation Stay
Admission: EM | Admit: 2018-05-12 | Discharge: 2018-05-12 | Disposition: A | Discharge status: Home / Self Care | Payer: BC Managed Care – PPO | Attending: Obstetrics and Gynecology | Admitting: Obstetrics and Gynecology

## 2018-05-12 DIAGNOSIS — O26893 Other specified pregnancy related conditions, third trimester: Principal | ICD-10-CM | POA: Diagnosis present

## 2018-05-12 DIAGNOSIS — R1030 Lower abdominal pain, unspecified: Secondary | ICD-10-CM | POA: Diagnosis not present

## 2018-05-12 DIAGNOSIS — Z3A32 32 weeks gestation of pregnancy: Secondary | ICD-10-CM | POA: Diagnosis not present

## 2018-05-12 DIAGNOSIS — M549 Dorsalgia, unspecified: Secondary | ICD-10-CM | POA: Insufficient documentation

## 2018-05-12 DIAGNOSIS — R109 Unspecified abdominal pain: Secondary | ICD-10-CM

## 2018-05-12 LAB — URINALYSIS, ROUTINE W REFLEX MICROSCOPIC
BILIRUBIN URINE: NEGATIVE
GLUCOSE, UA: NEGATIVE mg/dL
Hgb urine dipstick: NEGATIVE
Ketones, ur: 5 mg/dL — AB
Leukocytes, UA: NEGATIVE
Nitrite: NEGATIVE
PH: 7 (ref 5.0–8.0)
Protein, ur: NEGATIVE mg/dL
SPECIFIC GRAVITY, URINE: 1.01 (ref 1.005–1.030)

## 2018-05-12 MED ORDER — ACETAMINOPHEN 325 MG PO TABS
650.0000 mg | ORAL_TABLET | Freq: Four times a day (QID) | ORAL | Status: DC | PRN
  Administered 2018-05-12: 650 mg via ORAL
  Filled 2018-05-12: qty 2

## 2018-05-12 NOTE — Discharge Summary (Signed)
Reviewed discharge instructions with patient and follow up care. Gave patient opportunity for questions. All questions answered accordingly. Pt verbalized understanding. Pt discharged home with her husband.

## 2018-05-12 NOTE — Final Progress Note (Signed)
L&D OB Triage Note  Karen Mosley is a 37 y.o. 642P0101 female at 4449w6d, EDD Estimated Date of Delivery: 07/01/18 with CHTN in pregnancy (on Procardia) with h/o pre-eclampsia and preterm delivery in prior pregnancy, who presented to triage for complaints of lower abdominal pain and back pain ongoing x 1 day. Also with mild nauseaPatient no She was evaluated by the nurses with no significant findings. Vital signs stable. An NST was performed and has been reviewed by MD. She was treated with ES Tylenol an PO hydration.   NST INTERPRETATION: Indications: rule out uterine contractions  Mode: External Baseline Rate (A): 125 bpm Variability: Moderate Accelerations: 15 x 15 Decelerations: None     Contraction Frequency (min): UI  Impression: reactive   Labs:  Results for orders placed or performed during the hospital encounter of 05/12/18  Urinalysis, Routine w reflex microscopic  Result Value Ref Range   Color, Urine YELLOW (A) YELLOW   APPearance CLEAR (A) CLEAR   Specific Gravity, Urine 1.010 1.005 - 1.030   pH 7.0 5.0 - 8.0   Glucose, UA NEGATIVE NEGATIVE mg/dL   Hgb urine dipstick NEGATIVE NEGATIVE   Bilirubin Urine NEGATIVE NEGATIVE   Ketones, ur 5 (A) NEGATIVE mg/dL   Protein, ur NEGATIVE NEGATIVE mg/dL   Nitrite NEGATIVE NEGATIVE   Leukocytes, UA NEGATIVE NEGATIVE    Plan: NST performed was reviewed and was found to be reactive. UA notes small amount of ketones. She was discharged home with pre-eclampsia and preterm labor precautions. Advised to continue aggressive PO hydration.   Continue routine prenatal care. Follow up with OB/GYN as previously scheduled.     Hildred LaserAnika Madisen Ludvigsen, MD [facility]

## 2018-05-12 NOTE — OB Triage Note (Signed)
[name] here with c/o lower abdominal cramping, back cramping, nausea. Denies bleeding, LOF, reports positive fetal movement

## 2018-05-18 ENCOUNTER — Other Ambulatory Visit: Payer: BC Managed Care – PPO

## 2018-05-18 ENCOUNTER — Encounter: Payer: BC Managed Care – PPO | Admitting: Obstetrics and Gynecology

## 2018-05-30 ENCOUNTER — Encounter: Payer: Self-pay | Admitting: Obstetrics and Gynecology

## 2018-05-30 ENCOUNTER — Ambulatory Visit (INDEPENDENT_AMBULATORY_CARE_PROVIDER_SITE_OTHER): Payer: BC Managed Care – PPO | Admitting: Obstetrics and Gynecology

## 2018-05-30 ENCOUNTER — Other Ambulatory Visit: Payer: BC Managed Care – PPO

## 2018-05-30 DIAGNOSIS — Z3A35 35 weeks gestation of pregnancy: Secondary | ICD-10-CM

## 2018-05-30 DIAGNOSIS — O10913 Unspecified pre-existing hypertension complicating pregnancy, third trimester: Secondary | ICD-10-CM | POA: Diagnosis not present

## 2018-05-30 DIAGNOSIS — O0993 Supervision of high risk pregnancy, unspecified, third trimester: Secondary | ICD-10-CM

## 2018-05-30 NOTE — Progress Notes (Signed)
NONSTRESS TEST INTERPRETATION  INDICATIONS: chronic hypertension  RESULTS:  reactive COMMENTS:   PLAN: 1. Continue fetal kick counts as directed. 2. Continue antepartum testing as scheduled.  Elonda Huskyavid J. Barbera Perritt, M.D. 05/30/2018 11:13 AM

## 2018-06-02 ENCOUNTER — Ambulatory Visit (INDEPENDENT_AMBULATORY_CARE_PROVIDER_SITE_OTHER): Payer: BC Managed Care – PPO | Admitting: Obstetrics and Gynecology

## 2018-06-02 ENCOUNTER — Encounter: Payer: BC Managed Care – PPO | Admitting: Obstetrics and Gynecology

## 2018-06-02 ENCOUNTER — Other Ambulatory Visit: Payer: BC Managed Care – PPO

## 2018-06-02 ENCOUNTER — Ambulatory Visit: Payer: BC Managed Care – PPO | Admitting: Obstetrics and Gynecology

## 2018-06-02 VITALS — BP 145/92 | HR 93 | Wt 159.4 lb

## 2018-06-02 DIAGNOSIS — Z3493 Encounter for supervision of normal pregnancy, unspecified, third trimester: Secondary | ICD-10-CM

## 2018-06-02 LAB — POCT URINALYSIS DIPSTICK
BILIRUBIN UA: NEGATIVE
Blood, UA: NEGATIVE
GLUCOSE UA: NEGATIVE
KETONES UA: NEGATIVE
Leukocytes, UA: NEGATIVE
Nitrite, UA: NEGATIVE
PH UA: 7 (ref 5.0–8.0)
Protein, UA: POSITIVE — AB
Spec Grav, UA: 1.01 (ref 1.010–1.025)
UROBILINOGEN UA: 0.2 U/dL

## 2018-06-02 NOTE — Progress Notes (Signed)
NST

## 2018-06-05 ENCOUNTER — Encounter: Payer: Self-pay | Admitting: Obstetrics and Gynecology

## 2018-06-05 ENCOUNTER — Other Ambulatory Visit (HOSPITAL_COMMUNITY)
Admission: RE | Admit: 2018-06-05 | Discharge: 2018-06-05 | Disposition: A | Discharge status: Home / Self Care | Payer: BC Managed Care – PPO | Source: Ambulatory Visit | Attending: Obstetrics and Gynecology | Admitting: Obstetrics and Gynecology

## 2018-06-05 ENCOUNTER — Ambulatory Visit (INDEPENDENT_AMBULATORY_CARE_PROVIDER_SITE_OTHER): Payer: BC Managed Care – PPO | Admitting: Obstetrics and Gynecology

## 2018-06-05 ENCOUNTER — Encounter: Payer: BC Managed Care – PPO | Admitting: Obstetrics and Gynecology

## 2018-06-05 ENCOUNTER — Other Ambulatory Visit: Payer: BC Managed Care – PPO

## 2018-06-05 VITALS — BP 150/82 | HR 94 | Wt 158.5 lb

## 2018-06-05 DIAGNOSIS — Z331 Pregnant state, incidental: Secondary | ICD-10-CM

## 2018-06-05 DIAGNOSIS — Z3A36 36 weeks gestation of pregnancy: Secondary | ICD-10-CM

## 2018-06-05 DIAGNOSIS — Z113 Encounter for screening for infections with a predominantly sexual mode of transmission: Secondary | ICD-10-CM | POA: Diagnosis present

## 2018-06-05 DIAGNOSIS — O0993 Supervision of high risk pregnancy, unspecified, third trimester: Secondary | ICD-10-CM | POA: Insufficient documentation

## 2018-06-05 DIAGNOSIS — Z3685 Encounter for antenatal screening for Streptococcus B: Secondary | ICD-10-CM | POA: Insufficient documentation

## 2018-06-05 DIAGNOSIS — O09513 Supervision of elderly primigravida, third trimester: Secondary | ICD-10-CM

## 2018-06-05 DIAGNOSIS — R319 Hematuria, unspecified: Secondary | ICD-10-CM

## 2018-06-05 DIAGNOSIS — O10913 Unspecified pre-existing hypertension complicating pregnancy, third trimester: Secondary | ICD-10-CM | POA: Diagnosis not present

## 2018-06-05 DIAGNOSIS — O9989 Other specified diseases and conditions complicating pregnancy, childbirth and the puerperium: Secondary | ICD-10-CM

## 2018-06-05 DIAGNOSIS — Z1389 Encounter for screening for other disorder: Secondary | ICD-10-CM

## 2018-06-05 LAB — POCT URINALYSIS DIPSTICK
Bilirubin, UA: NEGATIVE
Glucose, UA: NEGATIVE
Ketones, UA: NEGATIVE
Nitrite, UA: NEGATIVE
Odor: NEGATIVE
PH UA: 6.5 (ref 5.0–8.0)
Protein, UA: POSITIVE — AB
Spec Grav, UA: 1.01 (ref 1.010–1.025)
UROBILINOGEN UA: 0.2 U/dL

## 2018-06-05 NOTE — Progress Notes (Signed)
NONSTRESS TEST INTERPRETATION  INDICATIONS: CHT, AMA  FHR baseline: RESULTS:Reactive COMMENTS:    PLAN: 1. Continue fetal kick counts twice a day. 2. Continue antepartum testing as scheduled-Biweekly 3.  Darol Destinerystal Amellia Panik, CMA

## 2018-06-05 NOTE — Addendum Note (Signed)
Addended by: Marchelle FolksMILLER, Kaylany G on: 06/05/2018 10:19 AM   Modules accepted: Orders

## 2018-06-05 NOTE — Progress Notes (Signed)
.  ROB:  Patient with blood pressures in the moderate chronic hypertension range.  Patient declined increase in Procardia because she says it affects her carpal tunnel syndrome too much.  NST and blood pressure check scheduled in 4 days.  Plan induction at 37 weeks.  (Scheduled for Monday 7/8) GC/CT-GBS performed today  NONSTRESS TEST INTERPRETATION  INDICATIONS: chronic hypertension  RESULTS:  reactive  PLAN: 1. Continue fetal kick counts as directed. 2. Continue antepartum testing as scheduled.  Elonda Huskyavid J. Avry Monteleone, M.D. 06/05/2018 9:44 AM

## 2018-06-06 LAB — GC/CHLAMYDIA PROBE AMP (~~LOC~~) NOT AT ARMC
Chlamydia: NEGATIVE
NEISSERIA GONORRHEA: NEGATIVE

## 2018-06-07 LAB — STREP GP B NAA: Strep Gp B NAA: NEGATIVE

## 2018-06-08 LAB — URINE CULTURE

## 2018-06-09 ENCOUNTER — Ambulatory Visit (INDEPENDENT_AMBULATORY_CARE_PROVIDER_SITE_OTHER): Payer: BC Managed Care – PPO | Admitting: Obstetrics and Gynecology

## 2018-06-09 VITALS — BP 132/79 | HR 85 | Wt 157.2 lb

## 2018-06-09 DIAGNOSIS — Z3A36 36 weeks gestation of pregnancy: Secondary | ICD-10-CM

## 2018-06-09 DIAGNOSIS — O10913 Unspecified pre-existing hypertension complicating pregnancy, third trimester: Secondary | ICD-10-CM

## 2018-06-09 DIAGNOSIS — O0993 Supervision of high risk pregnancy, unspecified, third trimester: Secondary | ICD-10-CM

## 2018-06-09 DIAGNOSIS — O09293 Supervision of pregnancy with other poor reproductive or obstetric history, third trimester: Secondary | ICD-10-CM | POA: Diagnosis not present

## 2018-06-09 LAB — POCT URINALYSIS DIPSTICK
Bilirubin, UA: NEGATIVE
Blood, UA: NEGATIVE
Glucose, UA: NEGATIVE
KETONES UA: NEGATIVE
Leukocytes, UA: NEGATIVE
NITRITE UA: NEGATIVE
PROTEIN UA: POSITIVE — AB
SPEC GRAV UA: 1.01 (ref 1.010–1.025)
Urobilinogen, UA: 0.2 E.U./dL
pH, UA: 7 (ref 5.0–8.0)

## 2018-06-09 NOTE — Progress Notes (Signed)
ROB: Patient currently with complaints of intermittent contractions. Discussed labor precautions. Is scheduled for IOL on Monday for cHTN.  Notes compliance with new dosing regimen of Procardia.    NONSTRESS TEST INTERPRETATION  INDICATIONS: Chronic hypertension  FHR baseline: 135 bpm RESULTS:Reactive COMMENTS: Irregular contractions 3-7 minutes.  BPs 142/87, 140/84, 141/87, 146/83.    PLAN: 1. Continue fetal kick counts twice a day. 2. Scheduled for IOL on Monday 06/12/18.

## 2018-06-09 NOTE — Progress Notes (Signed)
ROB-pt stated that she is doing well no complaints.  

## 2018-06-09 NOTE — Patient Instructions (Signed)
Labor Induction Labor induction is when steps are taken to cause a pregnant woman to begin the labor process. Most women go into labor on their own between 37 weeks and 42 weeks of the pregnancy. When this does not happen or when there is a medical need, methods may be used to induce labor. Labor induction causes a pregnant woman's uterus to contract. It also causes the cervix to soften (ripen), open (dilate), and thin out (efface). Usually, labor is not induced before 39 weeks of the pregnancy unless there is a problem with the baby or mother. Before inducing labor, your health care provider will consider a number of factors, including the following:  The medical condition of you and the baby.  How many weeks along you are.  The status of the baby's lung maturity.  The condition of the cervix.  The position of the baby. What are the reasons for labor induction? Labor may be induced for the following reasons:  The health of the baby or mother is at risk.  The pregnancy is overdue by 1 week or more.  The water breaks but labor does not start on its own.  The mother has a health condition or serious illness, such as high blood pressure, infection, placental abruption, or diabetes.  The amniotic fluid amounts are low around the baby.  The baby is distressed. Convenience or wanting the baby to be born on a certain date is not a reason for inducing labor. What methods are used for labor induction? Several methods of labor induction may be used, such as:  Prostaglandin medicine. This medicine causes the cervix to dilate and ripen. The medicine will also start contractions. It can be taken by mouth or by inserting a suppository into the vagina.  Inserting a thin tube (catheter) with a balloon on the end into the vagina to dilate the cervix. Once inserted, the balloon is expanded with water, which causes the cervix to open.  Stripping the membranes. Your health care provider separates  amniotic sac tissue from the cervix, causing the cervix to be stretched and causing the release of a hormone called progesterone. This may cause the uterus to contract. It is often done during an office visit. You will be sent home to wait for the contractions to begin. You will then come in for an induction.  Breaking the water. Your health care provider makes a hole in the amniotic sac using a small instrument. Once the amniotic sac breaks, contractions should begin. This may still take hours to see an effect.  Medicine to trigger or strengthen contractions. This medicine is given through an IV access tube inserted into a vein in your arm. All of the methods of induction, besides stripping the membranes, will be done in the hospital. Induction is done in the hospital so that you and the baby can be carefully monitored. How long does it take for labor to be induced? Some inductions can take up to 2-3 days. Depending on the cervix, it usually takes less time. It takes longer when you are induced early in the pregnancy or if this is your first pregnancy. If a mother is still pregnant and the induction has been going on for 2-3 days, either the mother will be sent home or a cesarean delivery will be needed. What are the risks associated with labor induction? Some of the risks of induction include:  Changes in fetal heart rate, such as too high, too low, or erratic.  Fetal distress.    Chance of infection for the mother and baby.  Increased chance of having a cesarean delivery.  Breaking off (abruption) of the placenta from the uterus (rare).  Uterine rupture (very rare). When induction is needed for medical reasons, the benefits of induction may outweigh the risks. What are some reasons for not inducing labor? Labor induction should not be done if:  It is shown that your baby does not tolerate labor.  You have had previous surgeries on your uterus, such as a myomectomy or the removal of  fibroids.  Your placenta lies very low in the uterus and blocks the opening of the cervix (placenta previa).  Your baby is not in a head-down position.  The umbilical cord drops down into the birth canal in front of the baby. This could cut off the baby's blood and oxygen supply.  You have had a previous cesarean delivery.  There are unusual circumstances, such as the baby being extremely premature. This information is not intended to replace advice given to you by your health care provider. Make sure you discuss any questions you have with your health care provider. Document Released: 04/13/2007 Document Revised: 04/29/2016 Document Reviewed: 06/21/2013 Elsevier Interactive Patient Education  2017 [employer]vier Inc.  

## 2018-06-11 ENCOUNTER — Other Ambulatory Visit: Payer: Self-pay

## 2018-06-11 ENCOUNTER — Encounter: Payer: Self-pay | Admitting: *Deleted

## 2018-06-11 ENCOUNTER — Observation Stay (HOSPITAL_BASED_OUTPATIENT_CLINIC_OR_DEPARTMENT_OTHER)
Admission: EM | Admit: 2018-06-11 | Discharge: 2018-06-11 | Disposition: A | Discharge status: Home / Self Care | Payer: BC Managed Care – PPO | Source: Home / Self Care | Admitting: Obstetrics and Gynecology

## 2018-06-11 DIAGNOSIS — O26893 Other specified pregnancy related conditions, third trimester: Secondary | ICD-10-CM

## 2018-06-11 DIAGNOSIS — R51 Headache: Secondary | ICD-10-CM | POA: Insufficient documentation

## 2018-06-11 DIAGNOSIS — O10013 Pre-existing essential hypertension complicating pregnancy, third trimester: Secondary | ICD-10-CM

## 2018-06-11 DIAGNOSIS — Z3A37 37 weeks gestation of pregnancy: Secondary | ICD-10-CM

## 2018-06-11 DIAGNOSIS — O479 False labor, unspecified: Secondary | ICD-10-CM | POA: Diagnosis present

## 2018-06-11 LAB — PROTEIN / CREATININE RATIO, URINE
CREATININE, URINE: 121 mg/dL
PROTEIN CREATININE RATIO: 0.33 mg/mg{creat} — AB (ref 0.00–0.15)
TOTAL PROTEIN, URINE: 40 mg/dL

## 2018-06-11 MED ORDER — ONDANSETRON 4 MG PO TBDP
4.0000 mg | ORAL_TABLET | Freq: Once | ORAL | Status: AC
  Administered 2018-06-11: 4 mg via ORAL
  Filled 2018-06-11: qty 1

## 2018-06-11 MED ORDER — BUTALBITAL-APAP-CAFFEINE 50-325-40 MG PO TABS
1.0000 | ORAL_TABLET | Freq: Once | ORAL | Status: AC
  Administered 2018-06-11: 1 via ORAL

## 2018-06-11 MED ORDER — BUTALBITAL-APAP-CAFFEINE 50-325-40 MG PO TABS
ORAL_TABLET | ORAL | Status: AC
  Filled 2018-06-11: qty 1

## 2018-06-11 MED ORDER — ACETAMINOPHEN 325 MG PO TABS
650.0000 mg | ORAL_TABLET | Freq: Four times a day (QID) | ORAL | Status: DC | PRN
  Administered 2018-06-11: 650 mg via ORAL
  Filled 2018-06-11: qty 2

## 2018-06-11 NOTE — OB Triage Note (Signed)
Ctx since 0800 this am. Denies vaginal bleeding / LOF. CO decreased fetal movement. Karen HoopsElks, Karen Mosley S

## 2018-06-11 NOTE — Progress Notes (Signed)
Pt wanting to go home. Feels as though HA may be due to not eating since breakfast. Karen Mosley, Heywood BeneLetitia S

## 2018-06-12 ENCOUNTER — Inpatient Hospital Stay: Payer: BC Managed Care – PPO | Admitting: Registered Nurse

## 2018-06-12 ENCOUNTER — Inpatient Hospital Stay
Admission: EM | Admit: 2018-06-12 | Discharge: 2018-06-13 | DRG: 807 | Disposition: A | Discharge status: Home / Self Care | Payer: BC Managed Care – PPO | Attending: Obstetrics and Gynecology | Admitting: Obstetrics and Gynecology

## 2018-06-12 DIAGNOSIS — Z3A37 37 weeks gestation of pregnancy: Secondary | ICD-10-CM

## 2018-06-12 DIAGNOSIS — O1002 Pre-existing essential hypertension complicating childbirth: Secondary | ICD-10-CM | POA: Diagnosis present

## 2018-06-12 DIAGNOSIS — Z7982 Long term (current) use of aspirin: Secondary | ICD-10-CM | POA: Diagnosis not present

## 2018-06-12 DIAGNOSIS — O1092 Unspecified pre-existing hypertension complicating childbirth: Secondary | ICD-10-CM | POA: Diagnosis not present

## 2018-06-12 DIAGNOSIS — Z3490 Encounter for supervision of normal pregnancy, unspecified, unspecified trimester: Secondary | ICD-10-CM

## 2018-06-12 LAB — COMPREHENSIVE METABOLIC PANEL
ALT: 24 U/L (ref 0–44)
AST: 23 U/L (ref 15–41)
Albumin: 2.9 g/dL — ABNORMAL LOW (ref 3.5–5.0)
Alkaline Phosphatase: 126 U/L (ref 38–126)
Anion gap: 8 (ref 5–15)
BUN: 11 mg/dL (ref 6–20)
CHLORIDE: 110 mmol/L (ref 98–111)
CO2: 20 mmol/L — AB (ref 22–32)
CREATININE: 0.59 mg/dL (ref 0.44–1.00)
Calcium: 9.1 mg/dL (ref 8.9–10.3)
GFR calc Af Amer: >60 mL/min (ref >60)
GFR calc non Af Amer: >60 mL/min (ref >60)
Glucose, Bld: 86 mg/dL (ref 70–99)
Potassium: 3.5 mmol/L (ref 3.5–5.1)
Sodium: 138 mmol/L (ref 135–145)
Total Bilirubin: 0.7 mg/dL (ref 0.3–1.2)
Total Protein: 6.2 g/dL — ABNORMAL LOW (ref 6.5–8.1)

## 2018-06-12 LAB — CBC WITH DIFFERENTIAL/PLATELET
BASOS ABS: 0 10*3/uL (ref 0–0.1)
Basophils Relative: 0 %
Eosinophils Absolute: 0.1 10*3/uL (ref 0–0.7)
Eosinophils Relative: 1 %
HEMATOCRIT: 34.7 % — AB (ref 35.0–47.0)
Hemoglobin: 12.4 g/dL (ref 12.0–16.0)
LYMPHS ABS: 2.1 10*3/uL (ref 1.0–3.6)
LYMPHS PCT: 19 %
MCH: 30 pg (ref 26.0–34.0)
MCHC: 35.9 g/dL (ref 32.0–36.0)
MCV: 83.6 fL (ref 80.0–100.0)
Monocytes Absolute: 0.6 10*3/uL (ref 0.2–0.9)
Monocytes Relative: 6 %
Neutro Abs: 8.1 10*3/uL — ABNORMAL HIGH (ref 1.4–6.5)
Neutrophils Relative %: 74 %
PLATELETS: 233 10*3/uL (ref 150–440)
RBC: 4.15 MIL/uL (ref 3.80–5.20)
RDW: 13 % (ref 11.5–14.5)
WBC: 10.9 10*3/uL (ref 3.6–11.0)

## 2018-06-12 LAB — TYPE AND SCREEN
ABO/RH(D): A POS
Antibody Screen: NEGATIVE

## 2018-06-12 MED ORDER — PRENATAL MULTIVITAMIN CH
1.0000 | ORAL_TABLET | Freq: Every day | ORAL | Status: DC
  Administered 2018-06-13: 1 via ORAL
  Filled 2018-06-12: qty 1

## 2018-06-12 MED ORDER — BENZOCAINE-MENTHOL 20-0.5 % EX AERO
1.0000 | INHALATION_SPRAY | CUTANEOUS | Status: DC | PRN
Start: 2018-06-12 — End: 2018-06-14
  Administered 2018-06-12: 1 via TOPICAL
  Filled 2018-06-12: qty 56

## 2018-06-12 MED ORDER — TETANUS-DIPHTH-ACELL PERTUSSIS 5-2.5-18.5 LF-MCG/0.5 IM SUSP: 0.5000 mL | Freq: Once | INTRAMUSCULAR | Status: DC

## 2018-06-12 MED ORDER — EPHEDRINE 5 MG/ML INJ
10.0000 mg | INTRAVENOUS | Status: DC | PRN
  Filled 2018-06-12: qty 2

## 2018-06-12 MED ORDER — TERBUTALINE SULFATE 1 MG/ML IJ SOLN: 0.2500 mg | Freq: Once | INTRAMUSCULAR | Status: DC | PRN

## 2018-06-12 MED ORDER — OXYTOCIN 40 UNITS IN LACTATED RINGERS INFUSION - SIMPLE MED
2.5000 [IU]/h | INTRAVENOUS | Status: DC | PRN
  Filled 2018-06-12: qty 1000

## 2018-06-12 MED ORDER — OXYCODONE-ACETAMINOPHEN 5-325 MG PO TABS: 1.0000 | ORAL_TABLET | ORAL | Status: DC | PRN

## 2018-06-12 MED ORDER — ACETAMINOPHEN 325 MG PO TABS
650.0000 mg | ORAL_TABLET | ORAL | Status: DC | PRN
  Administered 2018-06-12 – 2018-06-13 (×4): 650 mg via ORAL
  Filled 2018-06-12 (×4): qty 2

## 2018-06-12 MED ORDER — FENTANYL 2.5 MCG/ML W/ROPIVACAINE 0.15% IN NS 100 ML EPIDURAL (ARMC): 12.0000 mL/h | EPIDURAL | Status: DC

## 2018-06-12 MED ORDER — ACETAMINOPHEN 325 MG PO TABS
650.0000 mg | ORAL_TABLET | Freq: Four times a day (QID) | ORAL | Status: DC | PRN
Start: 2018-06-12 — End: 2018-06-14
  Administered 2018-06-12: 650 mg via ORAL

## 2018-06-12 MED ORDER — ZOLPIDEM TARTRATE 5 MG PO TABS: 5.0000 mg | ORAL_TABLET | Freq: Every evening | ORAL | Status: DC | PRN

## 2018-06-12 MED ORDER — LIDOCAINE-EPINEPHRINE (PF) 1.5 %-1:200000 IJ SOLN
INTRAMUSCULAR | Status: DC | PRN
  Administered 2018-06-12: 3 mL via EPIDURAL

## 2018-06-12 MED ORDER — DOCUSATE SODIUM 100 MG PO CAPS
100.0000 mg | ORAL_CAPSULE | Freq: Two times a day (BID) | ORAL | Status: DC
  Administered 2018-06-12 – 2018-06-13 (×2): 100 mg via ORAL
  Filled 2018-06-12 (×2): qty 1

## 2018-06-12 MED ORDER — ONDANSETRON HCL 4 MG/2ML IJ SOLN
INTRAMUSCULAR | Status: AC
  Administered 2018-06-12: 4 mg via INTRAVENOUS
  Filled 2018-06-12: qty 2

## 2018-06-12 MED ORDER — OXYTOCIN 40 UNITS IN LACTATED RINGERS INFUSION - SIMPLE MED
INTRAVENOUS | Status: AC
  Administered 2018-06-12: 500 mL/h
  Filled 2018-06-12: qty 1000

## 2018-06-12 MED ORDER — MISOPROSTOL 25 MCG QUARTER TABLET
50.0000 ug | ORAL_TABLET | ORAL | Status: DC
  Administered 2018-06-12: 50 ug via VAGINAL

## 2018-06-12 MED ORDER — BUPIVACAINE HCL (PF) 0.25 % IJ SOLN
INTRAMUSCULAR | Status: DC | PRN
  Administered 2018-06-12: 4 mL via EPIDURAL
  Administered 2018-06-12: 5 mL via EPIDURAL

## 2018-06-12 MED ORDER — PHENYLEPHRINE 40 MCG/ML (10ML) SYRINGE FOR IV PUSH (FOR BLOOD PRESSURE SUPPORT)
80.0000 ug | PREFILLED_SYRINGE | INTRAVENOUS | Status: DC | PRN
  Filled 2018-06-12: qty 5

## 2018-06-12 MED ORDER — FENTANYL 2.5 MCG/ML W/ROPIVACAINE 0.15% IN NS 100 ML EPIDURAL (ARMC)
EPIDURAL | Status: AC
  Filled 2018-06-12: qty 100

## 2018-06-12 MED ORDER — NIFEDIPINE 10 MG PO CAPS
30.0000 mg | ORAL_CAPSULE | Freq: Three times a day (TID) | ORAL | Status: DC
  Administered 2018-06-12 – 2018-06-13 (×3): 30 mg via ORAL
  Filled 2018-06-12 (×4): qty 3

## 2018-06-12 MED ORDER — SIMETHICONE 80 MG PO CHEW: 80.0000 mg | CHEWABLE_TABLET | ORAL | Status: DC | PRN

## 2018-06-12 MED ORDER — DIPHENHYDRAMINE HCL 25 MG PO CAPS
25.0000 mg | ORAL_CAPSULE | Freq: Four times a day (QID) | ORAL | Status: DC | PRN
Start: 2018-06-12 — End: 2018-06-14

## 2018-06-12 MED ORDER — ONDANSETRON HCL 4 MG/2ML IJ SOLN
4.0000 mg | Freq: Four times a day (QID) | INTRAMUSCULAR | Status: DC
  Administered 2018-06-12: 4 mg via INTRAVENOUS

## 2018-06-12 MED ORDER — LACTATED RINGERS IV SOLN
INTRAVENOUS | Status: DC
  Administered 2018-06-12: 08:00:00 via INTRAVENOUS

## 2018-06-12 MED ORDER — LABETALOL HCL 5 MG/ML IV SOLN
20.0000 mg | INTRAVENOUS | Status: DC | PRN
  Filled 2018-06-12: qty 16

## 2018-06-12 MED ORDER — LACTATED RINGERS IV SOLN
500.0000 mL | Freq: Once | INTRAVENOUS | Status: AC
  Administered 2018-06-12: 500 mL via INTRAVENOUS

## 2018-06-12 MED ORDER — DIPHENHYDRAMINE HCL 50 MG/ML IJ SOLN: 12.5000 mg | INTRAMUSCULAR | Status: DC | PRN

## 2018-06-12 MED ORDER — MISOPROSTOL 50MCG HALF TABLET
ORAL_TABLET | ORAL | Status: AC
  Administered 2018-06-12: 50 ug via VAGINAL
  Filled 2018-06-12: qty 1

## 2018-06-12 MED ORDER — HYDRALAZINE HCL 20 MG/ML IJ SOLN: 10.0000 mg | Freq: Once | INTRAMUSCULAR | Status: DC | PRN

## 2018-06-12 MED ORDER — LIDOCAINE HCL (PF) 1 % IJ SOLN
INTRAMUSCULAR | Status: DC | PRN
  Administered 2018-06-12: 3 mL via SUBCUTANEOUS

## 2018-06-12 MED ORDER — FENTANYL 2.5 MCG/ML W/ROPIVACAINE 0.15% IN NS 100 ML EPIDURAL (ARMC)
EPIDURAL | Status: DC | PRN
  Administered 2018-06-12: 12 mL/h via EPIDURAL

## 2018-06-12 MED ORDER — IBUPROFEN 600 MG PO TABS
600.0000 mg | ORAL_TABLET | Freq: Four times a day (QID) | ORAL | Status: DC
  Administered 2018-06-12 – 2018-06-13 (×5): 600 mg via ORAL
  Filled 2018-06-12 (×5): qty 1

## 2018-06-12 MED ORDER — ACETAMINOPHEN 325 MG PO TABS
ORAL_TABLET | ORAL | Status: AC
  Filled 2018-06-12: qty 2

## 2018-06-12 NOTE — Anesthesia Preprocedure Evaluation (Signed)
Anesthesia Evaluation  Patient identified by MRN, date of birth, ID band Patient awake    Reviewed: Allergy & Precautions, H&P , NPO status , Patient's Chart, lab work & pertinent test results  Airway Mallampati: II  TM Distance: >3 FB Neck ROM: full    Dental  (+) Teeth Intact   Pulmonary neg pulmonary ROS,    Pulmonary exam normal        Cardiovascular hypertension, Normal cardiovascular exam     Neuro/Psych    GI/Hepatic Neg liver ROS, GERD  ,  Endo/Other  negative endocrine ROS  Renal/GU negative Renal ROS  negative genitourinary   Musculoskeletal   Abdominal   Peds  Hematology negative hematology ROS (+)   Anesthesia Other Findings   Reproductive/Obstetrics (+) Pregnancy                             Anesthesia Physical Anesthesia Plan  ASA: II  Anesthesia Plan: Epidural   Post-op Pain Management:    Induction:   PONV Risk Score and Plan:   Airway Management Planned:   Additional Equipment:   Intra-op Plan:   Post-operative Plan:   Informed Consent: I have reviewed the patients History and Physical, chart, labs and discussed the procedure including the risks, benefits and alternatives for the proposed anesthesia with the patient or authorized representative who has indicated his/her understanding and acceptance.     Plan Discussed with: Anesthesiologist and CRNA  Anesthesia Plan Comments:         Anesthesia Quick Evaluation

## 2018-06-12 NOTE — Plan of Care (Signed)
  Problem: Education: Goal: Knowledge of General Education information will improve Outcome: Progressing   Problem: Coping: Goal: Ability to identify and utilize available resources and services will improve Outcome: Progressing

## 2018-06-12 NOTE — Discharge Summary (Signed)
L&D OB Triage Note  Karen Mosley is a 37 y.o. [id number] female at 6855w1d, EDD Estimated Date of Delivery: 07/01/18 who presented to triage for complaints of contractions and headaches. She has a PMH of CHTN in pregnancy. She was evaluated by the nurses with no significant findings for labor.  Vital signs stable. An NST was performed and has been reviewed by MD. She was treated with ES Tylenol and Fioricet for her headache.    Physical Exam: Vitals:   06/11/18 1216 06/11/18 1232 06/11/18 1528  BP: (!) 143/89  (!) 145/85  Pulse: 84  76  Resp: 16  16  Temp: 98 F (36.7 C)    TempSrc: Oral    Weight:  157 lb (71.2 kg)   Height:  5\' 2"  (1.575 m)    Cervical Exam: 3/60/-2 (performed by nurse), no change after 2 hrs.    NST INTERPRETATION: Indications: rule out uterine contractions  Mode: External Baseline Rate (A): 115 bpm Variability: Moderate Accelerations: 15 x 15 Decelerations: None     Contraction Frequency (min): 1.5-6  Impression: reactive   Plan: NST performed was reviewed and was found to be reactive. She was discharged home with bleeding/labor/PIH precautions.  Continue routine prenatal care. She is scheduled for induction of labor tomorrow.      Hildred LaserAnika Altha Sweitzer, MD [facility]

## 2018-06-12 NOTE — H&P (Signed)
History and Physical   HPI  Karen Lance BoschBrook Mosley is a 37 y.o. G2P0101 at 5029w2d Estimated Date of Delivery: 07/01/18 who is being admitted for Induction of labor for chronic hypertension    OB History  OB History  Gravida Para Term Preterm AB Living  2 1 0 1 0 1  SAB TAB Ectopic Multiple Live Births  0 0 0 0 1    # Outcome Date GA Lbr Len/2nd Weight Sex Delivery Anes PTL Lv  2 Current           1 Preterm 2006 4749w5d  6 lb (2.722 kg) M Vag-Vacuum  N LIV    Obstetric Comments  G1- pre-eclampsia, induced at ~ 34 weeks, with postpartum HTN, now chronic. Also with nuchal cord, vacuum assistance due to fetal distress. Marland Kitchen.     PROBLEM LIST  Pregnancy complications or risks: Patient Active Problem List   Diagnosis Date Noted  . Pregnancy 06/12/2018  . Irregular uterine contractions 06/11/2018  . Abdominal pain in pregnancy, third trimester 05/12/2018  . Carpal tunnel syndrome during pregnancy 04/26/2018  . History of preterm delivery, currently pregnant in third trimester 04/26/2018  . 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 H/O pre-eclampsia in prior pregnancy, currently pregnant, second trimester 01/11/2018  . Elderly multigravida in second trimester 01/11/2018  . Pregnancy, supervision, high-risk, third trimester 01/11/2018    Prenatal labs and studies: ABO, Rh: A/Positive/-- (12/18 0000) Antibody: Negative (02/06 1642) Rubella: 1.52 (02/06 1642) RPR: Non Reactive (05/23 1044)  HBsAg:    HIV: Non Reactive (02/06 1642)  ZOX:WRUEAVWUGBS:Negative (07/01 1040)   Past Medical History:  Diagnosis Date  . Hypertension   . Preeclampsia      Past Surgical History:  Procedure Laterality Date  . FRACTURE SURGERY    . WISDOM TOOTH EXTRACTION       Medications    Current Discharge Medication List    CONTINUE these medications which have NOT CHANGED   Details  aspirin EC 81 MG tablet Take 1 tablet (81 mg total) by mouth daily. Take after 12 weeks for  prevention of preeclampssia later in pregnancy Qty: 300 tablet, Refills: 2    NIFEdipine (PROCARDIA-XL/ADALAT-CC/NIFEDICAL-XL) 30 MG 24 hr tablet TAKE 1 TABLET BY MOUTH DAILY Qty: 90 tablet, Refills: 0    Prenatal Vit-Fe Fumarate-FA (MULTIVITAMIN-PRENATAL) 27-0.8 MG TABS tablet Take 1 tablet by mouth daily at 12 noon.         Allergies  Lobster [shellfish allergy]; Benadryl allergy [diphenhydramine hcl]; and Red dye  Review of Systems  Pertinent items are noted in HPI.  Physical Exam  BP (!) 144/95 (BP Location: Left Arm)   Pulse 82   Temp 97.7 F (36.5 C) (Oral)   Resp 16   Ht 5\' 2"  (1.575 m)   Wt 157 lb (71.2 kg)   SpO2 99%   BMI 28.72 kg/m   Lungs:  CTA B Cardio: RRR without M/R/G Abd: Soft, gravid, NT Presentation: cephalic EXT: No C/C/ 1+ Edema DTRs: 2+ B CERVIX: Dilation: 3 Effacement (%): 50 Cervical Position: Middle Station: -2 Presentation: Vertex Exam by:: Dr. Logan BoresEvans   See Prenatal records for more detailed PE.     AROM-clear  50 mcg misoprostol placed intravaginally using applicator.  FHR:  Variability: Good {> 6 bpm)  Toco: Uterine Contractions: Irregular   Test Results  Results for orders placed or performed during the hospital encounter of 06/12/18 (from the past 24 hour(s))  CBC with Differential/Platelet  Status: Abnormal   Collection Time: 06/12/18  8:22 AM  Result Value Ref Range   WBC 10.9 3.6 - 11.0 K/uL   RBC 4.15 3.80 - 5.20 MIL/uL   Hemoglobin 12.4 12.0 - 16.0 g/dL   HCT 16.1 (L) 09.6 - 04.5 %   MCV 83.6 80.0 - 100.0 fL   MCH 30.0 26.0 - 34.0 pg   MCHC 35.9 32.0 - 36.0 g/dL   RDW 40.9 81.1 - 91.4 %   Platelets 233 150 - 440 K/uL   Neutrophils Relative % 74 %   Neutro Abs 8.1 (H) 1.4 - 6.5 K/uL   Lymphocytes Relative 19 %   Lymphs Abs 2.1 1.0 - 3.6 K/uL   Monocytes Relative 6 %   Monocytes Absolute 0.6 0.2 - 0.9 K/uL   Eosinophils Relative 1 %   Eosinophils Absolute 0.1 0 - 0.7 K/uL   Basophils Relative 0 %    Basophils Absolute 0.0 0 - 0.1 K/uL  Comprehensive metabolic panel     Status: Abnormal   Collection Time: 06/12/18  8:22 AM  Result Value Ref Range   Sodium 138 135 - 145 mmol/L   Potassium 3.5 3.5 - 5.1 mmol/L   Chloride 110 98 - 111 mmol/L   CO2 20 (L) 22 - 32 mmol/L   Glucose, Bld 86 70 - 99 mg/dL   BUN 11 6 - 20 mg/dL   Creatinine, Ser 7.82 0.44 - 1.00 mg/dL   Calcium 9.1 8.9 - 95.6 mg/dL   Total Protein 6.2 (L) 6.5 - 8.1 g/dL   Albumin 2.9 (L) 3.5 - 5.0 g/dL   AST 23 15 - 41 U/L   ALT 24 0 - 44 U/L   Alkaline Phosphatase 126 38 - 126 U/L   Total Bilirubin 0.7 0.3 - 1.2 mg/dL   GFR calc non Af Amer >60 >60 mL/min   GFR calc Af Amer >60 >60 mL/min   Anion gap 8 5 - 15     Assessment   G2P0101 at [redacted]w[redacted]d Estimated Date of Delivery: 07/01/18  The fetus is reassuring.   Patient Active Problem List   Diagnosis Date Noted  . Pregnancy 06/12/2018  . Irregular uterine contractions 06/11/2018  . Abdominal pain in pregnancy, third trimester 05/12/2018  . Carpal tunnel syndrome during pregnancy 04/26/2018  . History of preterm delivery, currently pregnant in third trimester 04/26/2018  . 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 H/O pre-eclampsia in prior pregnancy, currently pregnant, second trimester 01/11/2018  . Elderly multigravida in second trimester 01/11/2018  . Pregnancy, supervision, high-risk, third trimester 01/11/2018    Plan  1. Admit to L&D :   anticipate vaginal delivery 2. EFM: -- Category 1 3. Epidural if desired.  Stadol for IV pain until epidural requested. 4. Admission labs   Elonda Husky, M.D. 06/12/2018 9:36 AM

## 2018-06-12 NOTE — Lactation Note (Signed)
This note was copied from a baby's chart. Lactation Consultation Note  Patient Name: Girl Eliyanna Agerton Today's Date: 06/12/2018     Maternal Data 2nd child, did not breastfeed first who is 37 yrs old   Feeding Feeding Type: Breast Fed Rooting, latched easily for first feeding LATCH Score  see flowsheet                 Interventions    Lactation Tools Discussed/Used     Consult Status      Dyann KiefMarsha D Denajah Farias 06/12/2018, 8:55 PM

## 2018-06-12 NOTE — Anesthesia Procedure Notes (Signed)
Epidural Patient location during procedure: OB Start time: 06/12/2018 11:20 AM End time: 06/12/2018 11:35 AM  Staffing Anesthesiologist: Yves Dillarroll, Paul, MD Resident/CRNA: Karoline CaldwellStarr, Primus Gritton, CRNA Performed: resident/CRNA   Preanesthetic Checklist Completed: patient identified, site marked, surgical consent, pre-op evaluation, timeout performed, IV checked, risks and benefits discussed and monitors and equipment checked  Epidural Patient position: sitting Prep: ChloraPrep Patient monitoring: heart rate, continuous pulse ox and blood pressure Approach: midline Location: L3-L4 Injection technique: LOR air  Needle:  Needle type: Tuohy  Needle gauge: 17 G Needle length: 9 cm and 9 Needle insertion depth: 6 cm Catheter type: closed end flexible Catheter size: 19 Gauge Catheter at skin depth: 12 cm Test dose: negative and 1.5% lidocaine with Epi 1:200 K  Assessment Events: blood not aspirated, injection not painful, no injection resistance, negative IV test and no paresthesia  Additional Notes 1 attempt Pt. Evaluated and documentation done after procedure finished. Patient identified. Risks/Benefits/Options discussed with patient including but not limited to bleeding, infection, nerve damage, paralysis, failed block, incomplete pain control, headache, blood pressure changes, nausea, vomiting, reactions to medication both or allergic, itching and postpartum back pain. Confirmed with bedside nurse the patient's most recent platelet count. Confirmed with patient that they are not currently taking any anticoagulation, have any bleeding history or any family history of bleeding disorders. Patient expressed understanding and wished to proceed. All questions were answered. Sterile technique was used throughout the entire procedure. Please see nursing notes for vital signs. Test dose was given through epidural catheter and negative prior to continuing to dose epidural or start infusion. Warning signs of  high block given to the patient including shortness of breath, tingling/numbness in hands, complete motor block, or any concerning symptoms with instructions to call for help. Patient was given instructions on fall risk and not to get out of bed. All questions and concerns addressed with instructions to call with any issues or inadequate analgesia.   Patient tolerated the insertion well without immediate complications.Reason for block:procedure for pain

## 2018-06-13 LAB — SYPHILIS: RPR W/REFLEX TO RPR TITER AND TREPONEMAL ANTIBODIES, TRADITIONAL SCREENING AND DIAGNOSIS ALGORITHM: RPR Ser Ql: NONREACTIVE

## 2018-06-13 NOTE — Progress Notes (Signed)
Earlier pt discharge canceled, waiting on on infant's TSB. TSB results were good and infant discharged home with mom.  pt discharged home with infant.  Discharge instructions, prescriptions and follow up appointment given to and reviewed with pt.  Pt verbalized understanding, all questions answered.  Escorted by auxiliary.

## 2018-06-13 NOTE — Discharge Summary (Signed)
                              Discharge Summary  Date of Admission: 06/12/2018  Date of Discharge: 06/13/2018  Admitting Diagnosis: Induction of labor at 6446w2d for hypertension  Mode of Delivery: normal spontaneous vaginal delivery                 Discharge Diagnosis: No other diagnosis   Intrapartum Procedures: Atificial rupture of membranes   Post partum procedures:   Complications: none                      Discharge Day SOAP Note:  Progress Note - Vaginal Delivery  Karen Mosley is a 37 y.o. L2G4010G2P1102 now PP day 1 s/p Vaginal, Spontaneous . Delivery was complicated by hypertension  Subjective  The patient has the following complaints: has no unusual complaints  Pain is controlled with current medications.   Patient is urinating without difficulty.  She is ambulating well.    Objective  Vital signs: BP 128/81 (BP Location: Right Arm)   Pulse 64   Temp 97.6 F (36.4 C) (Oral)   Resp 18   Ht 5\' 2"  (1.575 m)   Wt 157 lb (71.2 kg)   SpO2 99%   Breastfeeding? Unknown   BMI 28.72 kg/m   Physical Exam: Gen: NAD Fundus Fundal Tone: Firm  Lochia Amount: Small  Perineum Appearance: Intact, Edematous     Data Review Labs: CBC Latest Ref Rng & Units 06/12/2018 04/27/2018 01/11/2018  WBC 3.6 - 11.0 K/uL 10.9 7.8 8.6  Hemoglobin 12.0 - 16.0 g/dL 27.212.4 53.611.7 64.412.5  Hematocrit 35.0 - 47.0 % 34.7(L) 34.2 35.9  Platelets 150 - 440 K/uL 233 187 213   A POS  Assessment/Plan  Active Problems:   Pregnancy    Plan for discharge today.   Discharge Instructions: Per After Visit Summary. Activity: Advance as tolerated. Pelvic rest for 6 weeks.  Also refer to After Visit Summary Diet: Regular Medications: Allergies as of 06/13/2018      Reactions   Lobster [shellfish Allergy] Anaphylaxis   Benadryl Allergy [diphenhydramine Hcl] Swelling   Red Dye Swelling      Medication List    STOP taking these medications   aspirin EC 81 MG tablet     TAKE these medications     multivitamin-prenatal 27-0.8 MG Tabs tablet Take 1 tablet by mouth daily at 12 noon.   NIFEdipine 30 MG 24 hr tablet Commonly known as:  PROCARDIA-XL/ADALAT-CC/NIFEDICAL-XL TAKE 1 TABLET BY MOUTH DAILY What changed:    how much to take  how to take this  when to take this      Outpatient follow up:  Follow-up Information    Linzie CollinEvans, Caterin Tabares James, MD Follow up in 6 week(s).   Specialty:  Obstetrics and Gynecology Contact information: [address] [place] [address] [phone]          Postpartum contraception: Will discuss at first office visit post-partum  Discharged Condition: good  Discharged to: home  Newborn Data: Disposition:home with mother  Apgars: APGAR (1 MIN): 8   APGAR (5 MINS): 9   APGAR (10 MINS):    Baby Feeding: Breast    Elonda Huskyavid J. Liberty Seto, M.D. 06/13/2018 11:16 AM

## 2018-06-13 NOTE — Anesthesia Postprocedure Evaluation (Signed)
Anesthesia Post Note  Patient: Scientist, research (life sciences)Karen Mosley  Procedure(s) Performed: AN AD HOC LABOR EPIDURAL  Patient location during evaluation: Mother Baby Anesthesia Type: Epidural Level of consciousness: awake and alert Pain management: pain level controlled Vital Signs Assessment: post-procedure vital signs reviewed and stable Respiratory status: spontaneous breathing, nonlabored ventilation and respiratory function stable Cardiovascular status: stable Postop Assessment: no headache, no backache and epidural receding Anesthetic complications: no     Last Vitals:  Vitals:   06/12/18 2350 06/13/18 0354  BP: 112/70 128/81  Pulse: 73 64  Resp: 18 18  Temp: (!) 36.3 C 36.4 C  SpO2: 99% 99%    Last Pain:  Vitals:   06/13/18 0354  TempSrc: Oral  PainSc:                  Starling Mannsurtis,  Alexandr Oehler A

## 2018-07-20 ENCOUNTER — Other Ambulatory Visit: Payer: Self-pay | Admitting: Obstetrics and Gynecology

## 2018-07-28 ENCOUNTER — Encounter: Payer: Self-pay | Admitting: Obstetrics and Gynecology

## 2018-07-28 ENCOUNTER — Ambulatory Visit (INDEPENDENT_AMBULATORY_CARE_PROVIDER_SITE_OTHER): Payer: BC Managed Care – PPO | Admitting: Obstetrics and Gynecology

## 2018-07-28 DIAGNOSIS — Z30430 Encounter for insertion of intrauterine contraceptive device: Secondary | ICD-10-CM

## 2018-07-28 NOTE — Progress Notes (Signed)
Pt is present today for post-partum visit. Pt stated that she is breastfeeding her baby. Pt stated that she had sexually intercourse last week and used a condom.  PHQ-9=0

## 2018-07-28 NOTE — Progress Notes (Signed)
HPI:      [name] is a 37 y.o. [phone] who LMP was No LMP recorded.  Subjective:   She presents today 6 weeks postpartum.  She has resumed sexual activity using condoms.  She desires IUD for birth control.  She has no complaints.  She is breast-feeding full-time.  She previously had an IUD for birth control and liked it.    Hx: The following portions of the patient's history were reviewed and updated as appropriate:             She  has a past medical history of Hypertension and Preeclampsia. She does not have any pertinent problems on file. She  has a past surgical history that includes Fracture surgery and Wisdom tooth extraction. Her family history includes Breast cancer in her maternal grandmother; Hypertension in her father, maternal aunt, maternal grandmother, and mother; Stroke in her maternal grandmother. She  reports that she has quit smoking. She has never used smokeless tobacco. She reports that she drinks alcohol. She reports that she does not use drugs. She has a current medication list which includes the following prescription(s): nifedipine and multivitamin-prenatal. She is allergic to lobster [shellfish allergy]; benadryl allergy [diphenhydramine hcl]; and red dye.       Review of Systems:  Review of Systems  Constitutional: Denied constitutional symptoms, night sweats, recent illness, fatigue, fever, insomnia and weight loss.  Eyes: Denied eye symptoms, eye pain, photophobia, vision change and visual disturbance.  Ears/Nose/Throat/Neck: Denied ear, nose, throat or neck symptoms, hearing loss, nasal discharge, sinus congestion and sore throat.  Cardiovascular: Denied cardiovascular symptoms, arrhythmia, chest pain/pressure, edema, exercise intolerance, orthopnea and palpitations.  Respiratory: Denied pulmonary symptoms, asthma, pleuritic pain, productive sputum, cough, dyspnea and wheezing.  Gastrointestinal: Denied, gastro-esophageal reflux, melena, nausea and  vomiting.  Genitourinary: Denied genitourinary symptoms including symptomatic vaginal discharge, pelvic relaxation issues, and urinary complaints.  Musculoskeletal: Denied musculoskeletal symptoms, stiffness, swelling, muscle weakness and myalgia.  Dermatologic: Denied dermatology symptoms, rash and scar.  Neurologic: Denied neurology symptoms, dizziness, headache, neck pain and syncope.  Psychiatric: Denied psychiatric symptoms, anxiety and depression.  Endocrine: Denied endocrine symptoms including hot flashes and night sweats.   Meds:   Current Outpatient Medications on File Prior to Visit  Medication Sig Dispense Refill  . NIFEdipine (PROCARDIA-XL/ADALAT-CC/NIFEDICAL-XL) 30 MG 24 hr tablet 1 tablet two times daily 90 tablet 0  . Prenatal Vit-Fe Fumarate-FA (MULTIVITAMIN-PRENATAL) 27-0.8 MG TABS tablet Take 1 tablet by mouth daily at 12 noon.     No current facility-administered medications on file prior to visit.     Objective:     Vitals:   07/28/18 1008  BP: 123/79  Pulse: 64              Pelvic examination   Pelvic:   Vulva: Normal appearance.  No lesions.  No abnormal scarring.    Vagina: No lesions or abnormalities noted.  Support: Normal pelvic support.  Urethra No masses tenderness or scarring.  Meatus Normal size without lesions or prolapse.  Cervix: Normal ectropion.  No lesions.  Anus: Normal exam.  No lesions.  Perineum: Normal exam.  No lesions.  Healed well.          Bimanual   Uterus: Normal size.  Non-tender.  Mobile.  AV.  Adnexae: No masses.  Non-tender to palpation.  Cul-de-sac: Negative for abnormality.   IUD Procedure Pt has read the booklet and signed the appropriate forms regarding the Mirena IUD.  All of her  questions have been answered.   The cervix was cleansed with betadine solution.  After sounding the uterus and noting the position, the IUD was placed in the usual manner without problem.  The string was cut to the appropriate length.  The  patient tolerated the procedure well.     Assessment:    [id number] Patient Active Problem List   Diagnosis Date Noted  . Pregnancy 06/12/2018  . Irregular uterine contractions 06/11/2018  . Abdominal pain in pregnancy, third trimester 05/12/2018  . Carpal tunnel syndrome during pregnancy 04/26/2018  . History of preterm delivery, currently pregnant in third trimester 04/26/2018  . 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 H/O pre-eclampsia in prior pregnancy, currently pregnant, second trimester 01/11/2018  . Elderly multigravida in second trimester 01/11/2018  . Pregnancy, supervision, high-risk, third trimester 01/11/2018     1. Postpartum care and examination immediately after delivery   2. Encounter for insertion of mirena IUD        Plan:            1.  Patient may resume normal activities with exception of heavy lifting. Orders No orders of the defined types were placed in this encounter.   No orders of the defined types were placed in this encounter.     F/U  Return in about 4 weeks (around 08/25/2018) for For IUD f/u.  Elonda Husky, M.D. 07/28/2018 10:32 AM

## 2018-08-16 ENCOUNTER — Encounter: Payer: Self-pay | Admitting: Obstetrics and Gynecology

## 2018-08-16 ENCOUNTER — Ambulatory Visit (INDEPENDENT_AMBULATORY_CARE_PROVIDER_SITE_OTHER): Payer: BC Managed Care – PPO | Admitting: Obstetrics and Gynecology

## 2018-08-16 VITALS — BP 143/93 | HR 73 | Ht 62.0 in | Wt 144.0 lb

## 2018-08-16 DIAGNOSIS — Z30431 Encounter for routine checking of intrauterine contraceptive device: Secondary | ICD-10-CM

## 2018-08-16 NOTE — Progress Notes (Signed)
HPI:      [name] is a 37 y.o. [phone] who LMP was No LMP recorded.  Subjective:   She presents today for follow-up of IUD.  She states that she still has some spotting but nothing heavy.  Reports that she is having no pain from the IUD.  Her husband does feel like he notices the strings every single time and would like them cut shorter. As an aside, patient says that her carpal tunnel is significantly better but she still has some numbness in the tips of her fingers.  She will continue to follow this and if it does not improve would consider neurology appointment.    Hx: The following portions of the patient's history were reviewed and updated as appropriate:             She  has a past medical history of Hypertension and Preeclampsia. She does not have any pertinent problems on file. She  has a past surgical history that includes Fracture surgery and Wisdom tooth extraction. Her family history includes Breast cancer in her maternal grandmother; Hypertension in her father, maternal aunt, maternal grandmother, and mother; Stroke in her maternal grandmother. She  reports that she has quit smoking. She has never used smokeless tobacco. She reports that she drinks alcohol. She reports that she does not use drugs. She has a current medication list which includes the following prescription(s): nifedipine and multivitamin-prenatal. She is allergic to lobster [shellfish allergy]; benadryl allergy [diphenhydramine hcl]; and red dye.       Review of Systems:  Review of Systems  Constitutional: Denied constitutional symptoms, night sweats, recent illness, fatigue, fever, insomnia and weight loss.  Eyes: Denied eye symptoms, eye pain, photophobia, vision change and visual disturbance.  Ears/Nose/Throat/Neck: Denied ear, nose, throat or neck symptoms, hearing loss, nasal discharge, sinus congestion and sore throat.  Cardiovascular: Denied cardiovascular symptoms, arrhythmia, chest  pain/pressure, edema, exercise intolerance, orthopnea and palpitations.  Respiratory: Denied pulmonary symptoms, asthma, pleuritic pain, productive sputum, cough, dyspnea and wheezing.  Gastrointestinal: Denied, gastro-esophageal reflux, melena, nausea and vomiting.  Genitourinary: Denied genitourinary symptoms including symptomatic vaginal discharge, pelvic relaxation issues, and urinary complaints.  Musculoskeletal: Denied musculoskeletal symptoms, stiffness, swelling, muscle weakness and myalgia.  Dermatologic: Denied dermatology symptoms, rash and scar.  Neurologic: See HPI for additional information.  Psychiatric: Denied psychiatric symptoms, anxiety and depression.  Endocrine: Denied endocrine symptoms including hot flashes and night sweats.   Meds:   Current Outpatient Medications on File Prior to Visit  Medication Sig Dispense Refill  . NIFEdipine (PROCARDIA-XL/ADALAT-CC/NIFEDICAL-XL) 30 MG 24 hr tablet 1 tablet two times daily 90 tablet 0  . Prenatal Vit-Fe Fumarate-FA (MULTIVITAMIN-PRENATAL) 27-0.8 MG TABS tablet Take 1 tablet by mouth daily at 12 noon.     No current facility-administered medications on file prior to visit.     Objective:     Vitals:   08/16/18 1541  BP: (!) 143/93  Pulse: 73              Physical examination   Pelvic:   Vulva: Normal appearance.  No lesions.  Vagina: No lesions or abnormalities noted.  Support: Normal pelvic support.  Urethra No masses tenderness or scarring.  Meatus Normal size without lesions or prolapse.  Cervix: Normal appearance.  No lesions. IUD strings noted at cervical os and shortened at patient request.  Strings now located inside cervical os.  Anus: Normal exam.  No lesions.  Perineum: Normal exam.  No lesions.  Bimanual   Uterus: Normal size.  Non-tender.  Mobile.  AV.  Adnexae: No masses.  Non-tender to palpation.  Cul-de-sac: Negative for abnormality.     Assessment:    [id number] Patient Active Problem  List   Diagnosis Date Noted  . Pregnancy 06/12/2018  . Irregular uterine contractions 06/11/2018  . Abdominal pain in pregnancy, third trimester 05/12/2018  . Carpal tunnel syndrome during pregnancy 04/26/2018  . History of preterm delivery, currently pregnant in third trimester 04/26/2018  . 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 H/O pre-eclampsia in prior pregnancy, currently pregnant, second trimester 01/11/2018  . Elderly multigravida in second trimester 01/11/2018  . Pregnancy, supervision, high-risk, third trimester 01/11/2018     1. Surveillance of previously prescribed intrauterine contraceptive device        Plan:            1.  Expectant management, patient to follow-up for annual examination  2.  Neurology referral if patient finds that she would like this for her carpal tunnel. Orders No orders of the defined types were placed in this encounter.   No orders of the defined types were placed in this encounter.     F/U  Return for Annual Physical.  Elonda Husky, M.D. 08/16/2018 4:15 PM

## 2018-08-16 NOTE — Progress Notes (Signed)
Pt presents today for string check.Pt states she has bleeding off and on throughout the past month. Pt states her husband is complaining about feeling the strings.

## 2018-08-17 ENCOUNTER — Encounter: Payer: BC Managed Care – PPO | Admitting: Obstetrics and Gynecology

## 2018-09-06 ENCOUNTER — Other Ambulatory Visit: Payer: Self-pay | Admitting: Obstetrics and Gynecology

## 2018-10-07 ENCOUNTER — Other Ambulatory Visit: Payer: Self-pay | Admitting: Obstetrics and Gynecology

## 2018-11-12 ENCOUNTER — Other Ambulatory Visit: Payer: Self-pay | Admitting: Obstetrics and Gynecology

## 2019-07-08 ENCOUNTER — Other Ambulatory Visit: Payer: Self-pay | Admitting: Obstetrics and Gynecology

## 2020-02-06 DIAGNOSIS — F900 Attention-deficit hyperactivity disorder, predominantly inattentive type: Secondary | ICD-10-CM | POA: Insufficient documentation

## 2020-02-11 DIAGNOSIS — R7989 Other specified abnormal findings of blood chemistry: Secondary | ICD-10-CM | POA: Insufficient documentation

## 2020-11-10 ENCOUNTER — Other Ambulatory Visit: Payer: Self-pay | Admitting: Physician Assistant

## 2020-11-10 DIAGNOSIS — R945 Abnormal results of liver function studies: Secondary | ICD-10-CM

## 2020-11-24 ENCOUNTER — Ambulatory Visit
Admission: RE | Admit: 2020-11-24 | Discharge: 2020-11-24 | Disposition: A | Discharge status: Home / Self Care | Payer: BC Managed Care – PPO | Source: Ambulatory Visit | Attending: Physician Assistant | Admitting: Physician Assistant

## 2020-11-24 ENCOUNTER — Other Ambulatory Visit: Payer: Self-pay

## 2020-11-24 DIAGNOSIS — R945 Abnormal results of liver function studies: Secondary | ICD-10-CM | POA: Insufficient documentation

## 2021-04-22 ENCOUNTER — Emergency Department
Admission: EM | Admit: 2021-04-22 | Discharge: 2021-04-23 | Disposition: A | Discharge status: Home / Self Care | Payer: BC Managed Care – PPO | Attending: Emergency Medicine | Admitting: Emergency Medicine

## 2021-04-22 ENCOUNTER — Other Ambulatory Visit: Payer: Self-pay

## 2021-04-22 DIAGNOSIS — I10 Essential (primary) hypertension: Secondary | ICD-10-CM | POA: Diagnosis not present

## 2021-04-22 DIAGNOSIS — U071 COVID-19: Secondary | ICD-10-CM | POA: Diagnosis not present

## 2021-04-22 DIAGNOSIS — Z28310 Unvaccinated for covid-19: Secondary | ICD-10-CM | POA: Insufficient documentation

## 2021-04-22 DIAGNOSIS — R519 Headache, unspecified: Secondary | ICD-10-CM | POA: Diagnosis present

## 2021-04-22 DIAGNOSIS — Z87891 Personal history of nicotine dependence: Secondary | ICD-10-CM | POA: Insufficient documentation

## 2021-04-22 DIAGNOSIS — R112 Nausea with vomiting, unspecified: Secondary | ICD-10-CM | POA: Diagnosis not present

## 2021-04-22 DIAGNOSIS — Z79899 Other long term (current) drug therapy: Secondary | ICD-10-CM | POA: Diagnosis not present

## 2021-04-22 LAB — CBC WITH DIFFERENTIAL/PLATELET
Abs Immature Granulocytes: 0.02 10*3/uL (ref 0.00–0.07)
Basophils Absolute: 0 10*3/uL (ref 0.0–0.1)
Basophils Relative: 0 %
Eosinophils Absolute: 0 10*3/uL (ref 0.0–0.5)
Eosinophils Relative: 0 %
HCT: 37.9 % (ref 36.0–46.0)
Hemoglobin: 13 g/dL (ref 12.0–15.0)
Immature Granulocytes: 1 %
Lymphocytes Relative: 13 %
Lymphs Abs: 0.4 10*3/uL — ABNORMAL LOW (ref 0.7–4.0)
MCH: 30.7 pg (ref 26.0–34.0)
MCHC: 34.3 g/dL (ref 30.0–36.0)
MCV: 89.4 fL (ref 80.0–100.0)
Monocytes Absolute: 0.5 10*3/uL (ref 0.1–1.0)
Monocytes Relative: 14 %
Neutro Abs: 2.6 10*3/uL (ref 1.7–7.7)
Neutrophils Relative %: 72 %
Platelets: 186 10*3/uL (ref 150–400)
RBC: 4.24 MIL/uL (ref 3.87–5.11)
RDW: 12.3 % (ref 11.5–15.5)
WBC: 3.5 10*3/uL — ABNORMAL LOW (ref 4.0–10.5)
nRBC: 0 % (ref 0.0–0.2)

## 2021-04-22 LAB — BASIC METABOLIC PANEL
Anion gap: 12 (ref 5–15)
BUN: 11 mg/dL (ref 6–20)
CO2: 18 mmol/L — ABNORMAL LOW (ref 22–32)
Calcium: 8.7 mg/dL — ABNORMAL LOW (ref 8.9–10.3)
Chloride: 104 mmol/L (ref 98–111)
Creatinine, Ser: 0.88 mg/dL (ref 0.44–1.00)
GFR, Estimated: >60 mL/min (ref >60)
Glucose, Bld: 105 mg/dL — ABNORMAL HIGH (ref 70–99)
Potassium: 3.7 mmol/L (ref 3.5–5.1)
Sodium: 134 mmol/L — ABNORMAL LOW (ref 135–145)

## 2021-04-22 MED ORDER — ACETAMINOPHEN 500 MG PO TABS
1000.0000 mg | ORAL_TABLET | Freq: Once | ORAL | Status: AC
  Administered 2021-04-22: 1000 mg via ORAL
  Filled 2021-04-22: qty 2

## 2021-04-22 MED ORDER — ONDANSETRON 4 MG PO TBDP
4.0000 mg | ORAL_TABLET | Freq: Once | ORAL | Status: AC
  Administered 2021-04-22: 4 mg via ORAL
  Filled 2021-04-22: qty 1

## 2021-04-22 NOTE — ED Triage Notes (Signed)
Pt states she tested positive for COVID this morning, pt had a high fever at home, pt states she has been increasingly weak, has had severe headache and n/v.

## 2021-04-23 MED ORDER — HYDROCOD POLST-CPM POLST ER 10-8 MG/5ML PO SUER: 5.0000 mL | Freq: Two times a day (BID) | ORAL | 0 refills | Status: DC | PRN

## 2021-04-23 MED ORDER — ONDANSETRON 4 MG PO TBDP
4.0000 mg | ORAL_TABLET | Freq: Once | ORAL | Status: AC
  Administered 2021-04-23: 4 mg via ORAL
  Filled 2021-04-23: qty 1

## 2021-04-23 MED ORDER — ONDANSETRON 4 MG PO TBDP: ORAL_TABLET | ORAL | 0 refills | Status: DC

## 2021-04-23 MED ORDER — NIRMATRELVIR/RITONAVIR (PAXLOVID)TABLET: 3.0000 | ORAL_TABLET | Freq: Two times a day (BID) | ORAL | 0 refills | Status: AC

## 2021-04-23 MED ORDER — ALBUTEROL SULFATE HFA 108 (90 BASE) MCG/ACT IN AERS: INHALATION_SPRAY | RESPIRATORY_TRACT | 0 refills | Status: DC

## 2021-04-23 NOTE — Discharge Instructions (Signed)
As we discussed, although you have tested positive for COVID-19 (coronavirus), you do not need to be hospitalized at this time.  Read through all the included information including the recommendations from the CDC.  We recommend that you self-quarantine at home with your immediate family only (people with whom you have already been in contact) for about a week after your fever has gone away (without taking medication to make your temperature come down, such as Tylenol (acetaminophen)), after your respiratory symptoms have improved.  You should have as minimal contact as possible with anyone else including close family as per the CDC paperwork guidelines listed below. Follow-up with your doctor by phone or online as needed and return immediately to the emergency department or call 911 only if you develop new or worsening symptoms that concern you.  If you were prescribed any medications, please use them as instructed. ° °You can find up-to-date information about COVID-19 in [place] by calling the [place] Coronavirus Helpline: 1-866-462-3821. You may also call 2-1-1, or [phone], or additional resources.  You can also find information online at https://www.ncdhhs.gov/divisions/public-health/coronavirus-disease-2019-covid-19-response-[place], or on the Center for Disease Control (CDC) website at https://www.cdc.gov/coronavirus/2019-ncov/index.html. °

## 2021-04-23 NOTE — ED Provider Notes (Signed)
[facility] Emergency Department Provider Note  ____________________________________________   Event Date/Time   First MD Initiated Contact with Patient 04/22/21 2355     (approximate)  I have reviewed the triage vital signs and the nursing notes.   HISTORY  Chief Complaint Covid Positive    HPI Karen Mosley is a 40 y.o. female who presents for evaluation of generalized headache, malaise, body aches , mild nasal congestion, and nausea and vomiting.  The symptoms started yesterday.  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  She stayed home and took an at home COVID antigen test which came back positive.  She came in tonight because she has been vomiting and feeling severe symptoms overall.  She is not having any respiratory difficulties including no cough at this time.  No abdominal pain, just the vomiting.  She says that she is thrown up hard enough that her neck muscles hurt but she is not having any trouble swallowing.  She has been taking ibuprofen and Tylenol with minimal benefit.  Nothing in particular makes her feel better or worse.  She notes that she has not been vaccinated against COVID.  Symptoms are severe.        Past Medical History:  Diagnosis Date  . Hypertension   . Preeclampsia     Patient Active Problem List   Diagnosis Date Noted  . Pregnancy 06/12/2018  . Irregular uterine contractions 06/11/2018  . Abdominal pain in pregnancy, third trimester 05/12/2018  . Carpal tunnel syndrome during pregnancy 04/26/2018  . History of preterm delivery, currently pregnant in third trimester 04/26/2018  . 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 H/O pre-eclampsia in prior pregnancy, currently pregnant, second trimester 01/11/2018  . Elderly multigravida in second trimester 01/11/2018  . Pregnancy, supervision, high-risk, third trimester 01/11/2018    Past Surgical History:   Procedure Laterality Date  . FRACTURE SURGERY    . WISDOM TOOTH EXTRACTION      Prior to Admission medications   Medication Sig Start Date End Date Taking? Authorizing Provider  albuterol (VENTOLIN HFA) 108 (90 Base) MCG/ACT inhaler Inhale 2-4 puffs by mouth every 4 hours as needed for wheezing, cough, and/or shortness of breath 04/23/21  Yes Loleta Rose, MD  chlorpheniramine-HYDROcodone [facility] PENNKINETIC ER) 10-8 MG/5ML SUER Take 5 mLs by mouth every 12 (twelve) hours as needed for cough. 04/23/21  Yes Loleta Rose, MD  nirmatrelvir/ritonavir EUA (PAXLOVID) TABS Take 3 tablets by mouth 2 (two) times daily for 5 days. Patient GFR is 86 mL/min/1.65m^2. Take nirmatrelvir (150 mg) two tablets twice daily for 5 days together with ritonavir (100 mg) one tablet twice daily for 5 days. 04/23/21 04/28/21 Yes Loleta Rose, MD  ondansetron (ZOFRAN ODT) 4 MG disintegrating tablet Allow 1-2 tablets to dissolve in your mouth every 8 hours as needed for nausea/vomiting 04/23/21  Yes Loleta Rose, MD  NIFEdipine (PROCARDIA-XL/ADALAT-CC/NIFEDICAL-XL) 30 MG 24 hr tablet 1 tablet two times daily 07/20/18   Hildred Laser, MD  Prenatal Vit-Fe Fumarate-FA (MULTIVITAMIN-PRENATAL) 27-0.8 MG TABS tablet Take 1 tablet by mouth daily at 12 noon.    [provider]    Allergies Lobster [shellfish allergy], Benadryl allergy [diphenhydramine hcl], and Red dye  Family History  Problem Relation Age of Onset  . Hypertension Mother   . Hypertension Father   . Hypertension Maternal Aunt   . Stroke Maternal Grandmother   . Breast cancer Maternal Grandmother  with recurrence  . Hypertension Maternal Grandmother     Social History Social History   Tobacco Use  . Smoking status: Former Games developer  . Smokeless tobacco: Never Used  Vaping Use  . Vaping Use: Never used  Substance Use Topics  . Alcohol use: Yes    Comment: occass  . Drug use: Never    Review of Systems Constitutional: Positive for  fever at home and malaise. Eyes: No visual changes. ENT: Some pain in her throat after vomiting.  Mild nasal congestion. Cardiovascular: Denies chest pain. Respiratory: Denies shortness of breath.  Denies cough. Gastrointestinal: No abdominal pain.  Positive for nausea and vomiting. Genitourinary: Negative for dysuria. Musculoskeletal: Positive for generalized body aches.   Integumentary: Negative for rash. Neurological: Positive for generalized headache.  Negative for focal numbness nor weakness.   ____________________________________________   PHYSICAL EXAM:  VITAL SIGNS: ED Triage Vitals  Enc Vitals Group     BP 04/22/21 2146 116/75     Pulse Rate 04/22/21 2146 96     Resp 04/22/21 2146 18     Temp 04/22/21 2146 99.6 F (37.6 C)     Temp src --      SpO2 04/22/21 2146 99 %     Weight 04/22/21 2144 63.5 kg (140 lb)     Height 04/22/21 2144 1.575 m (5\' 2" )     Head Circumference --      Peak Flow --      Pain Score 04/22/21 2144 10     Pain Loc --      Pain Edu? --      Excl. in GC? --     Constitutional: Alert and oriented.  Appears uncomfortable but generally well-appearing particular given the circumstances. Eyes: Conjunctivae are normal.  Head: Atraumatic. Nose: No congestion/rhinnorhea. Mouth/Throat: Patient is wearing a mask. Neck: No stridor.  No meningeal signs.   Cardiovascular: Normal rate, regular rhythm. Good peripheral circulation. Respiratory: Normal respiratory effort.  No retractions.  Lungs are clear to auscultation bilaterally with no wheezes, rales, or rhonchi.  No accessory muscle usage.  No tachypnea. Gastrointestinal: Soft and nontender. No distention.  Musculoskeletal: No lower extremity tenderness nor edema. No gross deformities of extremities. Neurologic:  Normal speech and language. No gross focal neurologic deficits are appreciated.  Skin:  Skin is warm, dry and intact. Psychiatric: Mood and affect are normal. Speech and behavior are  normal.  ____________________________________________   LABS (all labs ordered are listed, but only abnormal results are displayed)  Labs Reviewed  CBC WITH DIFFERENTIAL/PLATELET - Abnormal; Notable for the following components:      Result Value   WBC 3.5 (*)    Lymphs Abs 0.4 (*)    All other components within normal limits  BASIC METABOLIC PANEL - Abnormal; Notable for the following components:   Sodium 134 (*)    CO2 18 (*)    Glucose, Bld 105 (*)    Calcium 8.7 (*)    All other components within normal limits   ____________________________________________   INITIAL IMPRESSION / MDM / ASSESSMENT AND PLAN / ED COURSE  As part of my medical decision making, I reviewed the following data within the electronic MEDICAL RECORD NUMBER Nursing notes reviewed and incorporated, Labs reviewed , Old chart reviewed and Notes from prior ED visits   Differential diagnosis includes, but is not limited to, COVID-19, pneumonia, electrolyte or metabolic abnormality, other nonspecific viral infection.  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  Her symptoms are all consistent with COVID-19 and she is unvaccinated.  No indication to repeat testing in the emergency department.  In spite of her symptoms, she is generally well-appearing with stable vital signs, no tachycardia, no hypoxemia.  She does not have any respiratory symptoms.  We gave her 2 separate doses of 4 mg of Zofran.  Based on current infectious disease recommendations, I wrote prescriptions as listed below, most notably for Paxlovid.  I calculated her GFR based on her reassuring lab work tonight and it is normal and she does not need a dose adjustment for the Paxlovid.  I stressed to her the importance of taking the antiviral, trying to stay hydrated, and using the other prescribed medications for symptom control in addition to ibuprofen and Tylenol  according label instructions.  I gave my usual COVID follow-up recommendations and return precautions and the patient says that she understands the plan.           ____________________________________________  FINAL CLINICAL IMPRESSION(S) / ED DIAGNOSES  Final diagnoses:  COVID-19     MEDICATIONS GIVEN DURING THIS VISIT:  Medications  acetaminophen (TYLENOL) tablet 1,000 mg (1,000 mg Oral Given 04/22/21 2150)  ondansetron (ZOFRAN-ODT) disintegrating tablet 4 mg (4 mg Oral Given 04/22/21 2150)  ondansetron (ZOFRAN-ODT) disintegrating tablet 4 mg (4 mg Oral Given 04/23/21 0053)     ED Discharge Orders         Ordered    nirmatrelvir/ritonavir EUA (PAXLOVID) TABS  2 times daily        04/23/21 0051    ondansetron (ZOFRAN ODT) 4 MG disintegrating tablet        04/23/21 0052    chlorpheniramine-HYDROcodone (TUSSIONEX PENNKINETIC ER) 10-8 MG/5ML SUER  Every 12 hours PRN        04/23/21 0052    albuterol (VENTOLIN HFA) 108 (90 Base) MCG/ACT inhaler       Note to Pharmacy: Pharmacy may substitute brand and size for insurance-approved equivalent   04/23/21 0053          *Please note:  Karen Mosley was evaluated in Emergency Department on 04/23/2021 for the symptoms described in the history of present illness. She was evaluated in the context of the global COVID-19 pandemic, which necessitated consideration that the patient might be at risk for infection with the SARS-CoV-2 virus that causes COVID-19. Institutional protocols and algorithms that pertain to the evaluation of patients at risk for COVID-19 are in a state of rapid change based on information released by regulatory bodies including the CDC and federal and state organizations. These policies and algorithms were followed during the patient's care in the ED.  Some ED evaluations and interventions may be delayed as a result of limited staffing during and after the pandemic.*  Note:  This document was prepared using Dragon  voice recognition software and may include unintentional dictation errors.   Loleta Rose, MD 04/23/21 [phone]

## 2021-07-25 ENCOUNTER — Encounter: Payer: Self-pay | Admitting: Nurse Practitioner

## 2021-07-25 DIAGNOSIS — D72819 Decreased white blood cell count, unspecified: Secondary | ICD-10-CM | POA: Insufficient documentation

## 2021-07-25 DIAGNOSIS — Z8759 Personal history of other complications of pregnancy, childbirth and the puerperium: Secondary | ICD-10-CM | POA: Insufficient documentation

## 2021-07-25 DIAGNOSIS — I10 Essential (primary) hypertension: Secondary | ICD-10-CM | POA: Insufficient documentation

## 2021-07-25 DIAGNOSIS — E871 Hypo-osmolality and hyponatremia: Secondary | ICD-10-CM | POA: Insufficient documentation

## 2021-07-29 ENCOUNTER — Encounter: Payer: Self-pay | Admitting: Nurse Practitioner

## 2021-07-29 ENCOUNTER — Ambulatory Visit: Payer: BC Managed Care – PPO | Admitting: Nurse Practitioner

## 2021-07-29 ENCOUNTER — Other Ambulatory Visit: Payer: Self-pay

## 2021-07-29 VITALS — BP 107/69 | HR 73 | Temp 98.0°F | Ht 62.7 in | Wt 147.4 lb

## 2021-07-29 DIAGNOSIS — F900 Attention-deficit hyperactivity disorder, predominantly inattentive type: Secondary | ICD-10-CM

## 2021-07-29 DIAGNOSIS — I10 Essential (primary) hypertension: Secondary | ICD-10-CM

## 2021-07-29 DIAGNOSIS — Z7689 Persons encountering health services in other specified circumstances: Secondary | ICD-10-CM | POA: Diagnosis not present

## 2021-07-29 MED ORDER — LOSARTAN POTASSIUM-HCTZ 50-12.5 MG PO TABS: 1.0000 | ORAL_TABLET | Freq: Every day | ORAL | 4 refills | Status: DC

## 2021-07-29 NOTE — Addendum Note (Signed)
Addended by: Aura Dials T on: 07/29/2021 09:52 AM   Modules accepted: Orders

## 2021-07-29 NOTE — Assessment & Plan Note (Signed)
Chronic, ongoing.  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  At this time continue current Adderall dosing, minimally uses -- for work only.  Discussed trial of switching to Vyvanse next visit and provided information for her to read over.  She was made aware of clinic policy = will need UDS next visit and a controlled substance contract + Q3MOS visits, which she is agreeable with.  Return in 4 weeks.

## 2021-07-29 NOTE — Patient Instructions (Signed)
Lisdexamfetamine Capsule What is this medication? LISDEXAMFETAMINE (lis DEX am fet a meen) treats attention-deficit hyperactivity disorder (ADHD). It may also be used to treat binge eating disorder. It works by reducing hyperactivity and impulsive behaviors. It belongs to a group ofmedications called stimulants. This medicine may be used for other purposes; ask your health care provider orpharmacist if you have questions. COMMON BRAND NAME(S): Vyvanse What should I tell my care team before I take this medication? They need to know if you have any of these conditions: Anxiety or panic attacks Circulation problems in fingers and toes Glaucoma Hardening or blockages of the arteries or heart blood vessels Heart disease or a heart defect High blood pressure History of a drug or alcohol abuse problem History of stroke Kidney disease Liver disease Mental illness Seizures Suicidal thoughts, plans, or attempt; a previous suicide attempt by you or a family member Thyroid disease Tourette's syndrome An unusual or allergic reaction to lisdexamfetamine, other medications, foods, dyes, or preservatives Pregnant or trying to get pregnant Breast-feeding How should I use this medication? Take this medication by mouth. Follow the directions on the prescription label. Swallow the capsules with a drink of water. You may open capsule and add to a glass of water, then drink right away. Take your doses at regular intervals. Do not take your medication more often than directed. Do not suddenly stop your medication. You must gradually reduce the dose, or you may feel withdrawaleffects. Ask your care team for advice. A special MedGuide will be given to you by the pharmacist with eachprescription and refill. Be sure to read this information carefully each time. Talk to your care team regarding the use of this medication in children. While this medication may be prescribed for children as young as 39 years of age  forselected conditions, precautions do apply. Overdosage: If you think you have taken too much of this medicine contact apoison control center or emergency room at once. NOTE: This medicine is only for you. Do not share this medicine with others. What if I miss a dose? If you miss a dose, take it as soon as you can. If it is almost time for yournext dose, take only that dose. Do not take double or extra doses. What may interact with this medication? Do not take this medication with any of the following: MAOIs like Carbex, Eldepryl, Marplan, Nardil, and Parnate Other stimulant medications for attention disorders, weight loss, or to stay awake This medication may also interact with the following: Acetazolamide Ammonium chloride Antacids Ascorbic acid Atomoxetine Caffeine Certain medications for blood pressure Certain medications for depression, anxiety, or psychotic disturbances Certain medications for seizures like carbamazepine, phenobarbital, phenytoin Certain medications for stomach problems like cimetidine, famotidine, omeprazole, lansoprazole Cold or allergy medications Green tea Levodopa Linezolid Medications for sleep during surgery Methenamine Norepinephrine Phenothiazines like chlorpromazine, mesoridazine, prochlorperazine, thioridazine Propoxyphene Sodium acid phosphate Sodium bicarbonate This list may not describe all possible interactions. Give your health care provider a list of all the medicines, herbs, non-prescription drugs, or dietary supplements you use. Also tell them if you smoke, drink alcohol, or use illegaldrugs. Some items may interact with your medicine. What should I watch for while using this medication? Visit your care team for regular check ups. This prescription requires that you follow special procedures with your care team and pharmacy. You will need to have a new written prescription from your care team every time you need arefill. This medication may  affect your concentration, or hide signs of  tiredness. Until you know how this medication affects you, do not drive, ride a bicycle,use machinery, or do anything that needs mental alertness. Tell your care team if this medication loses its effects, or if you feel you need to take more than the prescribed amount. Do not change your dose withouttalking to your care team. Decreased appetite is a common side effect when starting this medication. Eating small, frequent meals or snacks can help. Talk to your care team if you continue to have poor eating habits. Height and weight growth of a child takingthis medication will be monitored closely. Do not take this medication close to bedtime. It may prevent you from sleeping. If you are going to need surgery, a MRI, CT scan, or other procedure, tell your care team that you are taking this medication. You may need to stop taking thismedication before the procedure. Tell your care team right away if you notice unexplained wounds on your fingers and toes while taking this medication. You should also tell your care team if you experience numbness or pain, changes in the skin color, or sensitivity totemperature in your fingers or toes. What side effects may I notice from receiving this medication? Side effects that you should report to your care team as soon as possible: Allergic reactions-skin rash, itching, hives, swelling of the face, lips, tongue, or throat Heart rhythm changes-fast or irregular heartbeat, dizziness, feeling faint or lightheaded, chest pain, trouble breathing Increase in blood pressure Mood and behavior changes-anxiety, nervousness, confusion, hallucinations, irritability, hostility, thoughts of suicide or self-harm, worsening mood, feelings of depression Painful or prolonged erection Raynaud's-cool, numb, or painful fingers or toes that may change color from pale, to blue, to red Stroke in adults-sudden numbness or weakness of the face, arm, or  leg, trouble speaking, confusion, trouble walking, loss of balance or coordination, dizziness, severe headache, change in vision Side effects that usually do not require medical attention (report to your careteam if they continue or are bothersome): Anxiety, nervousness Blurry vision Headache Loss of appetite Nausea Trouble sleeping Weight loss This list may not describe all possible side effects. Call your doctor for medical advice about side effects. You may report side effects to FDA at1-800-FDA-1088. Where should I keep my medication? Keep out of the reach of children and pets. This medication can be abused. Keep your medication in a safe place to protect it from theft. Do not share this medication with anyone. Selling or giving away this medication is dangerous andagainst the law. Store at room temperature between 15 and 30 degrees C (59 and 86 degrees F).Protect from light. Keep container tightly closed. Throw away any unused medication after the expiration date. NOTE: This sheet is a summary. It may not cover all possible information. If you have questions about this medicine, talk to your doctor, pharmacist, orhealth care provider.  2022 Elsevier/Gold Standard (2020-12-24 11:59:23)

## 2021-07-29 NOTE — Progress Notes (Signed)
New Patient Office Visit  Subjective:  Patient ID: Karen Mosley, female    DOB: 01/25/1981  Age: 40 y.o. MRN: [id number]  CC:  Chief Complaint  Patient presents with   Establish Care   Hypertension    Pt states she is currently taking lisinopril HCTZ, would like to discuss changing back to Losartan HCTZ   ADD    Pt states she would like to discuss coming off of her Adderall    HPI [employer] [facility] presents for new patient visit to establish care.  Introduced to Publishing rights manager role and practice setting.  All questions answered.  Discussed provider/patient relationship and expectations.  Previously was seen at family practice in [place].  She is an EMT.  ADD FOLLOW UP Has been on Adderall XR 20 MG for several years -- this past year has noticed bad side effects with this and at previous clinic they tried Wellbutrin, but had an anaphylactic reaction to this.  She would like to try something other then Adderall.  She was diagnosed in 2016 to 2017 with ADD -- initially tried to treat herself at home.  PDMP review last fill 01/07/21. ADHD status: controlled Satisfied with current therapy: no Medication compliance:  good compliance Controlled substance contract: no Previous psychiatry evaluation: yes Previous medications: yes adderall, adderall XR, and wellbutrin   Taking meds on weekends/vacations: no Work/school performance:  good Difficulty sustaining attention/completing tasks: no Distracted by extraneous stimuli: no Does not listen when spoken to: no  Fidgets with hands or feet: no Unable to stay in seat: no Blurts out/interrupts others: no ADHD Medication Side Effects: yes    Decreased appetite: yes    Headache: yes    Sleeping disturbance pattern:  if took too late in day    Irritability: yes    Rebound effects (worse than baseline) off medication: no    Anxiousness: no    Dizziness: no    Tics: no  HYPERTENSION Would like to go back to Losartan-HCTZ, which  she was previously on, was stopped on this due to past recall.  Currently taking Lisinopril-HCTZ -- started having dry and nagging cough.  Has a history of preeclampsia with both pregnancies.  History of mother having an ablation in past -- atrial fibrillation. Hypertension status: controlled  Satisfied with current treatment? yes Duration of hypertension: chronic BP monitoring frequency:   when it is elevated and she has symptoms only BP range: 150/90 range recently BP medication side effects:  yes Medication compliance: good compliance Previous BP meds: Losartan Aspirin: no Recurrent headaches:  only with pressure changes Visual changes: no Palpitations:  occasional -- every 6 months or so for some time Dyspnea: no Chest pain: no Lower extremity edema: no Dizzy/lightheaded: no   Past Medical History:  Diagnosis Date   Hypertension    Pessary maintenance    Preeclampsia     Past Surgical History:  Procedure Laterality Date   FRACTURE SURGERY Right    arm   WISDOM TOOTH EXTRACTION      Family History  Problem Relation Age of Onset   Hypertension Mother    Hypertension Father    Heart attack Father    Asthma Son    ADD / ADHD Son    Hypertension Maternal Aunt    Kidney disease Maternal Grandmother    Stroke Maternal Grandmother    Breast cancer Maternal Grandmother        with recurrence   Hypertension Maternal Grandmother    Pulmonary  embolism Maternal Grandfather    Congestive Heart Failure Maternal Grandfather    Pulmonary embolism Paternal Grandfather    Hypertension Paternal Grandfather     Social History   Socioeconomic History   Marital status: Married    Spouse name: Not on file   Number of children: Not on file   Years of education: Not on file   Highest education level: Not on file  Occupational History   Not on file  Tobacco Use   Smoking status: Former   Smokeless tobacco: Never  Vaping Use   Vaping Use: Never used  Substance and Sexual  Activity   Alcohol use: Yes    Comment: occass   Drug use: Never   Sexual activity: Yes    Birth control/protection: Condom  Other Topics Concern   Not on file  Social History Narrative   Not on file   Social Determinants of Health   Financial Resource Strain: Low Risk    Difficulty of Paying Living Expenses: Not hard at all  Food Insecurity: No Food Insecurity   Worried About Programme researcher, broadcasting/film/video in the Last Year: Never true   Ran Out of Food in the Last Year: Never true  Transportation Needs: No Transportation Needs   Lack of Transportation (Medical): No   Lack of Transportation (Non-Medical): No  Physical Activity: Insufficiently Active   Days of Exercise per Week: 2 days   Minutes of Exercise per Session: 50 min  Stress: Stress Concern Present   Feeling of Stress : To some extent  Social Connections: Moderately Integrated   Frequency of Communication with Friends and Family: More than three times a week   Frequency of Social Gatherings with Friends and Family: More than three times a week   Attends Religious Services: More than 4 times per year   Active Member of [employer] or Organizations: No   Attends Engineer, structural: Never   Marital Status: Married  Catering manager Violence: Not At Risk   Fear of Current or Ex-Partner: No   Emotionally Abused: No   Physically Abused: No   Sexually Abused: No    ROS Review of Systems  Constitutional:  Negative for activity change, appetite change, diaphoresis, fatigue and fever.  Respiratory:  Negative for cough, chest tightness and shortness of breath.   Cardiovascular:  Negative for chest pain, palpitations and leg swelling.  Gastrointestinal: Negative.   Neurological: Negative.   Psychiatric/Behavioral:  Positive for decreased concentration. Negative for self-injury, sleep disturbance and suicidal ideas. The patient is not nervous/anxious.    Objective:   Today's Vitals: BP 107/69   Pulse 73   Temp 98 F (36.7  C) (Oral)   Ht 5' 2.7" (1.593 m)   Wt 147 lb 6.4 oz (66.9 kg)   LMP  (LMP Unknown)   SpO2 97%   BMI 26.36 kg/m   Physical Exam Vitals and nursing note reviewed.  Constitutional:      General: She is awake. She is not in acute distress.    Appearance: She is well-developed and well-groomed. She is not ill-appearing or toxic-appearing.  HENT:     Head: Normocephalic.     Right Ear: Hearing, tympanic membrane, ear canal and external ear normal.     Left Ear: Hearing, tympanic membrane, ear canal and external ear normal.     Nose: Nose normal.  Eyes:     General: Lids are normal.        Right eye: No discharge.  Left eye: No discharge.     Conjunctiva/sclera: Conjunctivae normal.     Pupils: Pupils are equal, round, and reactive to light.  Neck:     Thyroid: No thyromegaly.     Vascular: No carotid bruit.  Cardiovascular:     Rate and Rhythm: Normal rate and regular rhythm.     Heart sounds: Normal heart sounds. No murmur heard.   No gallop.  Pulmonary:     Effort: Pulmonary effort is normal. No accessory muscle usage or respiratory distress.     Breath sounds: Normal breath sounds.  Abdominal:     General: Bowel sounds are normal.     Palpations: Abdomen is soft. There is no hepatomegaly or splenomegaly.  Musculoskeletal:     Cervical back: Normal range of motion and neck supple.     Right lower leg: No edema.     Left lower leg: No edema.  Lymphadenopathy:     Cervical: No cervical adenopathy.  Skin:    General: Skin is warm and dry.  Neurological:     Mental Status: She is alert and oriented to person, place, and time.  Psychiatric:        Attention and Perception: Attention normal.        Mood and Affect: Mood normal.        Speech: Speech normal.        Behavior: Behavior normal. Behavior is cooperative.        Thought Content: Thought content normal.    Assessment & Plan:   Problem List Items Addressed This Visit       Cardiovascular and  Mediastinum   Essential hypertension    Chronic, ongoing with strong family history.  At this time will find out from pharmacy her previous Losartan-HCTZ dosing as can not find on Care Everywhere, plan on stopping Lisinopril-HCTZ due to dry cough.  Will send in script once find previous dosing.  Recommend she monitor BP at least a few mornings a week at home and document.  DASH diet at home.  Labs next visit: CMP, CBC, TSH.  Return in 4 weeks.       Relevant Medications   lisinopril-hydrochlorothiazide (ZESTORETIC) 10-12.5 MG tablet     Other   Attention deficit hyperactivity disorder, predominantly inattentive type    Chronic, ongoing.  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  At this time continue current Adderall dosing, minimally uses -- for work only.  Discussed trial of switching to Vyvanse next visit and provided information for her to read over.  She was made aware of clinic policy = will need UDS next visit and a controlled substance contract + Q3MOS visits, which she is agreeable with.  Return in 4 weeks.      Other Visit Diagnoses     Encounter to establish care    -  Primary       Outpatient Encounter Medications as of 07/29/2021  Medication Sig   lisinopril-hydrochlorothiazide (ZESTORETIC) 10-12.5 MG tablet Take 1 tablet by mouth daily.   amphetamine-dextroamphetamine (ADDERALL XR) 20 MG 24 hr capsule dextroamphetamine-amphetamine ER 20 mg 24hr capsule,extend release  TAKE 1 CAPSULE BY MOUTH EVERY DAY AS DIRECTED (Patient not taking: Reported on 07/29/2021)   [DISCONTINUED] albuterol (VENTOLIN HFA) 108 (90 Base) MCG/ACT inhaler Inhale 2-4 puffs by mouth every 4 hours as needed for wheezing, cough, and/or shortness of breath   [DISCONTINUED] chlorpheniramine-HYDROcodone (TUSSIONEX PENNKINETIC ER) 10-8 MG/5ML SUER Take 5 mLs by mouth every 12 (twelve) hours as needed for cough.   [  DISCONTINUED] NIFEdipine (PROCARDIA-XL/ADALAT-CC/NIFEDICAL-XL) 30 MG 24 hr tablet 1 tablet two times  daily   [DISCONTINUED] ondansetron (ZOFRAN ODT) 4 MG disintegrating tablet Allow 1-2 tablets to dissolve in your mouth every 8 hours as needed for nausea/vomiting   [DISCONTINUED] Prenatal Vit-Fe Fumarate-FA (MULTIVITAMIN-PRENATAL) 27-0.8 MG TABS tablet Take 1 tablet by mouth daily at 12 noon.   No facility-administered encounter medications on file as of 07/29/2021.    Follow-up: Return in about 4 weeks (around 08/26/2021) for Annual physical with pap.   Marjie Skiff, NP

## 2021-07-29 NOTE — Assessment & Plan Note (Signed)
Chronic, ongoing with strong family history.  At this time will find out from pharmacy her previous Losartan-HCTZ dosing as can not find on Care Everywhere, plan on stopping Lisinopril-HCTZ due to dry cough.  Will send in script once find previous dosing.  Recommend she monitor BP at least a few mornings a week at home and document.  DASH diet at home.  Labs next visit: CMP, CBC, TSH.  Return in 4 weeks.

## 2021-08-31 ENCOUNTER — Encounter: Payer: BC Managed Care – PPO | Admitting: Nurse Practitioner

## 2021-09-21 ENCOUNTER — Ambulatory Visit: Payer: BC Managed Care – PPO | Admitting: Nurse Practitioner

## 2021-09-21 ENCOUNTER — Encounter: Payer: BC Managed Care – PPO | Admitting: Nurse Practitioner

## 2021-09-21 ENCOUNTER — Encounter: Payer: Self-pay | Admitting: Nurse Practitioner

## 2021-09-21 ENCOUNTER — Other Ambulatory Visit: Payer: Self-pay

## 2021-09-21 VITALS — BP 96/61 | HR 80 | Temp 98.1°F | Wt 151.2 lb

## 2021-09-21 DIAGNOSIS — F900 Attention-deficit hyperactivity disorder, predominantly inattentive type: Secondary | ICD-10-CM | POA: Diagnosis not present

## 2021-09-21 DIAGNOSIS — Z1159 Encounter for screening for other viral diseases: Secondary | ICD-10-CM

## 2021-09-21 DIAGNOSIS — I10 Essential (primary) hypertension: Secondary | ICD-10-CM

## 2021-09-21 DIAGNOSIS — E559 Vitamin D deficiency, unspecified: Secondary | ICD-10-CM

## 2021-09-21 DIAGNOSIS — Z79899 Other long term (current) drug therapy: Secondary | ICD-10-CM

## 2021-09-21 DIAGNOSIS — Z13220 Encounter for screening for lipoid disorders: Secondary | ICD-10-CM

## 2021-09-21 DIAGNOSIS — Z136 Encounter for screening for cardiovascular disorders: Secondary | ICD-10-CM

## 2021-09-21 DIAGNOSIS — Z124 Encounter for screening for malignant neoplasm of cervix: Secondary | ICD-10-CM

## 2021-09-21 DIAGNOSIS — Z23 Encounter for immunization: Secondary | ICD-10-CM | POA: Diagnosis not present

## 2021-09-21 MED ORDER — LISDEXAMFETAMINE DIMESYLATE 30 MG PO CAPS: 30.0000 mg | ORAL_CAPSULE | Freq: Every day | ORAL | 0 refills | Status: DC

## 2021-09-21 NOTE — Progress Notes (Signed)
Established Patient Office Visit  Subjective:  Patient ID: Karen Mosley, female    DOB: [birth year]  Age: 40 y.o. MRN: [id number]  CC:  Chief Complaint  Patient presents with   Hypertension    Pt states she has still been getting some elevated BP readings at home since switching to Losartan last visit    HPI [employer] [facility] presents for follow up on hypertension and ADHD.   HYPERTENSION  She was changed from lisinopril to losartan due to cough. Her blood pressure was still elevated on 50-12.5 so she was increased to 2 tablets daily. She states that her blood pressure at home has been reading 130s-140s still with increased dose. She denies headaches, dizziness, chest pain, and shortness of breath.   ADHD FOLLOW UP  ADHD status:  Adderall making her mad and would like to switch  Satisfied with current therapy: no Medication compliance:  excellent compliance Controlled substance contract:  no, will have her sign with PCP next visit Previous psychiatry evaluation: no Previous medications: yes adderall, adderall XR, and wellbutrin   Taking meds on weekends/vacations: no Work/school performance:  excellent Difficulty sustaining attention/completing tasks: yes Distracted by extraneous stimuli: yes Does not listen when spoken to: no  Fidgets with hands or feet: yes Unable to stay in seat: no Blurts out/interrupts others: no ADHD Medication Side Effects: yes    Decreased appetite: no    Headache: no    Sleeping disturbance pattern: no    Irritability: yes    Rebound effects (worse than baseline) off medication: no    Anxiousness: no    Dizziness: no    Tics: no   Past Medical History:  Diagnosis Date   Hypertension    Pessary maintenance    Preeclampsia     Past Surgical History:  Procedure Laterality Date   FRACTURE SURGERY Right    arm   WISDOM TOOTH EXTRACTION      Family History  Problem Relation Age of Onset   Hypertension Mother    Hypertension  Father    Heart attack Father    Asthma Son    ADD / ADHD Son    Hypertension Maternal Aunt    Kidney disease Maternal Grandmother    Stroke Maternal Grandmother    Breast cancer Maternal Grandmother        with recurrence   Hypertension Maternal Grandmother    Pulmonary embolism Maternal Grandfather    Congestive Heart Failure Maternal Grandfather    Pulmonary embolism Paternal Grandfather    Hypertension Paternal Grandfather     Social History   Socioeconomic History   Marital status: Married    Spouse name: Not on file   Number of children: Not on file   Years of education: Not on file   Highest education level: Not on file  Occupational History   Not on file  Tobacco Use   Smoking status: Former   Smokeless tobacco: Never  Vaping Use   Vaping Use: Never used  Substance and Sexual Activity   Alcohol use: Yes    Comment: occass   Drug use: Never   Sexual activity: Yes    Birth control/protection: Condom  Other Topics Concern   Not on file  Social History Narrative   Not on file   Social Determinants of Health   Financial Resource Strain: Low Risk    Difficulty of Paying Living Expenses: Not hard at all  Food Insecurity: No Food Insecurity   Worried About Running Out  of Food in the Last Year: Never true   Ran Out of Food in the Last Year: Never true  Transportation Needs: No Transportation Needs   Lack of Transportation (Medical): No   Lack of Transportation (Non-Medical): No  Physical Activity: Insufficiently Active   Days of Exercise per Week: 2 days   Minutes of Exercise per Session: 50 min  Stress: Stress Concern Present   Feeling of Stress : To some extent  Social Connections: Moderately Integrated   Frequency of Communication with Friends and Family: More than three times a week   Frequency of Social Gatherings with Friends and Family: More than three times a week   Attends Religious Services: More than 4 times per year   Active Member of [employer] or  Organizations: No   Attends Banker Meetings: Never   Marital Status: Married  Catering manager Violence: Not At Risk   Fear of Current or Ex-Partner: No   Emotionally Abused: No   Physically Abused: No   Sexually Abused: No    Outpatient Medications Prior to Visit  Medication Sig Dispense Refill   losartan-hydrochlorothiazide (HYZAAR) 50-12.5 MG tablet Take 1 tablet by mouth daily. 90 tablet 4   amphetamine-dextroamphetamine (ADDERALL XR) 20 MG 24 hr capsule dextroamphetamine-amphetamine ER 20 mg 24hr capsule,extend release  TAKE 1 CAPSULE BY MOUTH EVERY DAY AS DIRECTED (Patient not taking: No sig reported)     No facility-administered medications prior to visit.    Allergies  Allergen Reactions   Antihistamines, Chlorpheniramine-Type Anaphylaxis   Lobster [Shellfish Allergy] Anaphylaxis   Penicillins Nausea And Vomiting and Rash   Wellbutrin [Bupropion] Anaphylaxis   Benadryl Allergy [Diphenhydramine Hcl] Swelling   Red Dye Swelling    Red 44     ROS Review of Systems  Constitutional:  Positive for fatigue.  HENT: Negative.    Respiratory: Negative.    Cardiovascular: Negative.   Gastrointestinal: Negative.   Genitourinary: Negative.   Skin: Negative.   Neurological: Negative.   Psychiatric/Behavioral:  Positive for decreased concentration.      Objective:    Physical Exam Vitals and nursing note reviewed.  Constitutional:      General: She is not in acute distress.    Appearance: Normal appearance.  HENT:     Head: Normocephalic and atraumatic.  Eyes:     Conjunctiva/sclera: Conjunctivae normal.  Cardiovascular:     Rate and Rhythm: Normal rate and regular rhythm.     Pulses: Normal pulses.     Heart sounds: Normal heart sounds.  Pulmonary:     Effort: Pulmonary effort is normal.     Breath sounds: Normal breath sounds.  Musculoskeletal:     Cervical back: Normal range of motion.  Skin:    General: Skin is warm and dry.  Neurological:      General: No focal deficit present.     Mental Status: She is alert and oriented to person, place, and time.  Psychiatric:        Mood and Affect: Mood normal.        Behavior: Behavior normal.        Thought Content: Thought content normal.        Judgment: Judgment normal.    BP 96/61 (BP Location: Left Arm, Cuff Size: Normal)   Pulse 80   Temp 98.1 F (36.7 C) (Oral)   Wt 151 lb 3.2 oz (68.6 kg)   LMP  (LMP Unknown)   SpO2 98%   BMI 27.04 kg/m  Wt  Readings from Last 3 Encounters:  09/21/21 151 lb 3.2 oz (68.6 kg)  07/29/21 147 lb 6.4 oz (66.9 kg)  04/22/21 140 lb (63.5 kg)     Health Maintenance Due  Topic Date Due   PAP SMEAR-Modifier  Never done    There are no preventive care reminders to display for this patient.  No results found for: TSH Lab Results  Component Value Date   WBC 3.5 (L) 04/22/2021   HGB 13.0 04/22/2021   HCT 37.9 04/22/2021   MCV 89.4 04/22/2021   PLT 186 04/22/2021   Lab Results  Component Value Date   NA 134 (L) 04/22/2021   K 3.7 04/22/2021   CO2 18 (L) 04/22/2021   GLUCOSE 105 (H) 04/22/2021   BUN 11 04/22/2021   CREATININE 0.88 04/22/2021   BILITOT 0.7 06/12/2018   ALKPHOS 126 06/12/2018   AST 23 06/12/2018   ALT 24 06/12/2018   PROT 6.2 (L) 06/12/2018   ALBUMIN 2.9 (L) 06/12/2018   CALCIUM 8.7 (L) 04/22/2021   ANIONGAP 12 04/22/2021   No results found for: CHOL No results found for: HDL No results found for: LDLCALC No results found for: TRIG No results found for: CHOLHDL No results found for: GGEZ6O    Assessment & Plan:   Problem List Items Addressed This Visit       Cardiovascular and Mediastinum   Essential hypertension - Primary    Blood pressure recheck today 92/64. Recommend that she goes back down to 1 tablet of losartan-hctz 50mg -12.5mg  daily. Continue to monitor blood pressure at home. She can bring in her home cuff and compare it to office cuff for accuracy. Follow up in 3 months or sooner with  concerns. Checking CMP, CBC, TSH today.       Relevant Orders   CBC with Differential/Platelet   Comprehensive metabolic panel   TSH     Other   Attention deficit hyperactivity disorder, predominantly inattentive type    Chronic, ongoing. Currently taking adderall xr however it is causing irritability. She had tried wellbutrin in the past and had anaphylactic reaction. PCP had discussed changing to vyvanse last visit. Will start vyvanse 30mg  daily. UDS obtained today. Discussed controlled substance agreement and she will sign this with her PCP who will be filling future prescriptions. PDMP reviewed. Follow up in 3 months or sooner with any concerns.       Relevant Orders   Urine drugs of abuse scrn w alc, routine (Ref Lab)   Other Visit Diagnoses     Encounter for hepatitis C screening test for low risk patient       Check hepatitis C today   Relevant Orders   Hepatitis C antibody   Encounter for lipid screening for cardiovascular disease       Check lipid panel today   Relevant Orders   Lipid Panel w/o Chol/HDL Ratio   Need for influenza vaccination       Flu vaccine given today   Relevant Orders   Flu Vaccine QUAD 32mo+IM (Fluarix, Fluzone & Alfiuria Quad PF) (Completed)       Meds ordered this encounter  Medications   lisdexamfetamine (VYVANSE) 30 MG capsule    Sig: Take 1 capsule (30 mg total) by mouth daily.    Dispense:  30 capsule    Refill:  0    Follow-up: Return in about 3 months (around 12/22/2021) for adhd with pap.    5mo, NP

## 2021-09-21 NOTE — Assessment & Plan Note (Signed)
Chronic, ongoing. Currently taking adderall xr however it is causing irritability. She had tried wellbutrin in the past and had anaphylactic reaction. PCP had discussed changing to vyvanse last visit. Will start vyvanse 30mg  daily. UDS obtained today. Discussed controlled substance agreement and she will sign this with her PCP who will be filling future prescriptions. PDMP reviewed. Follow up in 3 months or sooner with any concerns.

## 2021-09-21 NOTE — Assessment & Plan Note (Signed)
Blood pressure recheck today 92/64. Recommend that she goes back down to 1 tablet of losartan-hctz 50mg -12.5mg  daily. Continue to monitor blood pressure at home. She can bring in her home cuff and compare it to office cuff for accuracy. Follow up in 3 months or sooner with concerns. Checking CMP, CBC, TSH today.

## 2021-09-22 LAB — CBC WITH DIFFERENTIAL/PLATELET
Basophils Absolute: 0 10*3/uL (ref 0.0–0.2)
Basos: 1 %
EOS (ABSOLUTE): 0.2 10*3/uL (ref 0.0–0.4)
Eos: 2 %
Hematocrit: 40.5 % (ref 34.0–46.6)
Hemoglobin: 14 g/dL (ref 11.1–15.9)
Immature Grans (Abs): 0 10*3/uL (ref 0.0–0.1)
Immature Granulocytes: 0 %
Lymphocytes Absolute: 2.5 10*3/uL (ref 0.7–3.1)
Lymphs: 33 %
MCH: 30.2 pg (ref 26.6–33.0)
MCHC: 34.6 g/dL (ref 31.5–35.7)
MCV: 87 fL (ref 79–97)
Monocytes Absolute: 0.4 10*3/uL (ref 0.1–0.9)
Monocytes: 6 %
Neutrophils Absolute: 4.4 10*3/uL (ref 1.4–7.0)
Neutrophils: 58 %
Platelets: 306 10*3/uL (ref 150–450)
RBC: 4.64 x10E6/uL (ref 3.77–5.28)
RDW: 12.2 % (ref 11.7–15.4)
WBC: 7.6 10*3/uL (ref 3.4–10.8)

## 2021-09-22 LAB — COMPREHENSIVE METABOLIC PANEL
ALT: 37 IU/L — ABNORMAL HIGH (ref 0–32)
AST: 27 IU/L (ref 0–40)
Albumin/Globulin Ratio: 2.5 — ABNORMAL HIGH (ref 1.2–2.2)
Albumin: 5 g/dL — ABNORMAL HIGH (ref 3.8–4.8)
Alkaline Phosphatase: 64 IU/L (ref 44–121)
BUN/Creatinine Ratio: 27 — ABNORMAL HIGH (ref 9–23)
BUN: 21 mg/dL — ABNORMAL HIGH (ref 6–20)
Bilirubin Total: 0.4 mg/dL (ref 0.0–1.2)
CO2: 26 mmol/L (ref 20–29)
Calcium: 9.7 mg/dL (ref 8.7–10.2)
Chloride: 98 mmol/L (ref 96–106)
Creatinine, Ser: 0.77 mg/dL (ref 0.57–1.00)
Globulin, Total: 2 g/dL (ref 1.5–4.5)
Glucose: 83 mg/dL (ref 70–99)
Potassium: 3.8 mmol/L (ref 3.5–5.2)
Sodium: 138 mmol/L (ref 134–144)
Total Protein: 7 g/dL (ref 6.0–8.5)
eGFR: 101 mL/min/{1.73_m2} (ref >59)

## 2021-09-22 LAB — URINE DRUGS OF ABUSE SCREEN W ALC, ROUTINE (REF LAB)
Amphetamines, Urine: NEGATIVE ng/mL
Barbiturate Quant, Ur: NEGATIVE ng/mL
Benzodiazepine Quant, Ur: NEGATIVE ng/mL
Cannabinoid Quant, Ur: NEGATIVE ng/mL
Cocaine (Metab.): NEGATIVE ng/mL
Ethanol, Urine: NEGATIVE %
Methadone Screen, Urine: NEGATIVE ng/mL
Opiate Quant, Ur: NEGATIVE ng/mL
PCP Quant, Ur: NEGATIVE ng/mL
Propoxyphene: NEGATIVE ng/mL

## 2021-09-22 LAB — LIPID PANEL W/O CHOL/HDL RATIO
Cholesterol, Total: 225 mg/dL — ABNORMAL HIGH (ref 100–199)
HDL: 51 mg/dL (ref >39)
LDL Chol Calc (NIH): 137 mg/dL — ABNORMAL HIGH (ref 0–99)
Triglycerides: 209 mg/dL — ABNORMAL HIGH (ref 0–149)
VLDL Cholesterol Cal: 37 mg/dL (ref 5–40)

## 2021-09-22 LAB — HEPATITIS C ANTIBODY: Hep C Virus Ab: <0.1 s/co ratio (ref 0.0–0.9)

## 2021-09-22 LAB — TSH: TSH: 1.94 u[IU]/mL (ref 0.450–4.500)

## 2021-10-05 ENCOUNTER — Telehealth: Payer: Self-pay

## 2021-10-05 ENCOUNTER — Telehealth: Payer: Self-pay | Admitting: Nurse Practitioner

## 2021-10-05 NOTE — Telephone Encounter (Signed)
Copied from CRM [id number]. Topic: General - Call Back - No Documentation >> Oct 02, 2021 11:08 AM Randol Kern wrote: Reason for CRM: [phone] pt is missing medication, already had appt/labs. Please advise, pt has questions for the clinic.

## 2021-10-05 NOTE — Telephone Encounter (Signed)
PA for vyvanse 30 mg capsule initiated via CoverMyMeds.  Key: [id number] Waiting on response

## 2021-10-05 NOTE — Telephone Encounter (Signed)
Patient states she has not been able to pick up vyvanse yet. Will do prior authorization for medication.

## 2021-10-05 NOTE — Telephone Encounter (Signed)
Patient called stating her insurance has not been covering her doctors office visit. Would like to know why, states she has not had any problems before.

## 2021-10-06 NOTE — Telephone Encounter (Signed)
PA for Vyvanse 30 mg capsule approved via CoverMyMeds Called and left VM for patient to notify of approval.

## 2021-12-20 DIAGNOSIS — E7800 Pure hypercholesterolemia, unspecified: Secondary | ICD-10-CM | POA: Insufficient documentation

## 2021-12-22 ENCOUNTER — Encounter: Payer: Self-pay | Admitting: Nurse Practitioner

## 2021-12-22 ENCOUNTER — Ambulatory Visit: Payer: BC Managed Care – PPO | Admitting: Nurse Practitioner

## 2021-12-22 DIAGNOSIS — Z79899 Other long term (current) drug therapy: Secondary | ICD-10-CM

## 2021-12-22 DIAGNOSIS — Z124 Encounter for screening for malignant neoplasm of cervix: Secondary | ICD-10-CM

## 2021-12-22 DIAGNOSIS — F900 Attention-deficit hyperactivity disorder, predominantly inattentive type: Secondary | ICD-10-CM

## 2021-12-22 DIAGNOSIS — I10 Essential (primary) hypertension: Secondary | ICD-10-CM

## 2022-03-13 NOTE — Patient Instructions (Incomplete)
Living With Attention Deficit Hyperactivity Disorder If you have been diagnosed with attention deficit hyperactivity disorder (ADHD), you may be relieved that you now know why you have felt or behaved a certain way. Still, you may feel overwhelmed about the treatment ahead. You may also wonder how to get the support you need and how to deal with the condition day-to-day. With treatment and support, you can live with ADHD and manage your symptoms. How to manage lifestyle changes Managing stress Stress is your body's reaction to life changes and events, both good and bad. To cope with the stress of an ADHD diagnosis, it may help to: Learn more about ADHD. Exercise regularly. Even a short daily walk can lower stress levels. Participate in training or education programs (including social skills training classes) that teach you to deal with symptoms.  Medicines Your health care provider may suggest certain medicines if he or she feels that they will help to improve your condition. Stimulant medicines are usually prescribed to treat ADHD, and therapy may also be prescribed. It is important to: Avoid using alcohol and other substances that may prevent your medicines from working properly (may interact). Talk with your pharmacist or health care provider about all the medicines that you take, their possible side effects, and what medicines are safe to take together. Make it your goal to take part in all treatment decisions (shared decision-making). Ask about possible side effects of medicines that your health care provider recommends, and tell him or her how you feel about having those side effects. It is best if shared decision-making with your health care provider is part of your total treatment plan. Relationships To strengthen your relationships with family members while treating your condition, consider taking part in family therapy. You might also attend self-help groups alone or with a loved one. Be  honest about how your symptoms affect your relationships. Make an effort to communicate respectfully instead of fighting, and find ways to show others that you care. Psychotherapy may be useful in helping you cope with how ADHD affects your relationships. How to recognize changes in your condition The following signs may mean that your treatment is working well and your condition is improving: Consistently being on time for appointments. Being more organized at home and work. Other people noticing improvements in your behavior. Achieving goals that you set for yourself. Thinking more clearly. The following signs may mean that your treatment is not working very well: Feeling impatience or more confusion. Missing, forgetting, or being late for appointments. An increasing sense of disorganization and messiness. More difficulty in reaching goals that you set for yourself. Loved ones becoming angry or frustrated with you. Follow these instructions at home: Take over-the-counter and prescription medicines only as told by your health care provider. Check with your health care provider before taking any new medicines. Create structure and an organized atmosphere at home. For example: Make a list of tasks, then rank them from most important to least important. Work on one task at a time until your listed tasks are done. Make a daily schedule and follow it consistently every day. Use an appointment calendar, and check it 2 or 3 times a day to keep on track. Keep it with you when you leave the house. Create spaces where you keep certain things, and always put things back in their places after you use them. Keep all follow-up visits as told by your health care provider. This is important. Where to find support Talking to others    Keep emotion out of important discussions and speak in a calm, logical way. Listen closely and patiently to your loved ones. Try to understand their point of view, and try to  avoid getting defensive. Take responsibility for the consequences of your actions. Ask that others do not take your behaviors personally. Aim to solve problems as they come up, and express your feelings instead of bottling them up. Talk openly about what you need from your loved ones and how they can support you. Consider going to family therapy sessions or having your family meet with a specialist who deals with ADHD-related behavior problems. Finances Not all insurance plans cover mental health care, so it is important to check with your insurance carrier. If paying for co-pays or counseling services is a problem, search for a local or county mental health care center. Public mental health care services may be offered there at a low cost or no cost when you are not able to see a private health care provider. If you are taking medicine for ADHD, you may be able to get the generic form, which may be less expensive than brand-name medicine. Some makers of prescription medicines also offer help to patients who cannot afford the medicines that they need. Questions to ask your health care provider: What are the risks and benefits of taking medicines? Would I benefit from therapy? How often should I follow up with a health care provider? Contact a health care provider if: You have side effects from your medicines, such as: Repeated muscle twitches, coughing, or speech outbursts. Sleep problems. Loss of appetite. Depression. New or worsening behavior problems. Dizziness. Unusually fast heartbeat. Stomach pains. Headaches. Get help right away if: You have a severe reaction to a medicine. Your behavior suddenly gets worse. Summary With treatment and support, you can live with ADHD and manage your symptoms. The medicines that are most often prescribed for ADHD are stimulants. Consider taking part in family therapy or self-help groups with family members or friends. When you talk with friends  and family about your ADHD, be patient and communicate openly. Take over-the-counter and prescription medicines only as told by your health care provider. Check with your health care provider before taking any new medicines. This information is not intended to replace advice given to you by your health care provider. Make sure you discuss any questions you have with your health care provider. Document Revised: 05/07/2020 Document Reviewed: 05/07/2020 Elsevier Patient Education  2022 [employer] Inc[employer]  

## 2022-03-19 ENCOUNTER — Ambulatory Visit: Payer: BC Managed Care – PPO | Admitting: Nurse Practitioner

## 2022-03-28 NOTE — Patient Instructions (Incomplete)
Living With Attention Deficit Hyperactivity Disorder If you have been diagnosed with attention deficit hyperactivity disorder (ADHD), you may be relieved that you now know why you have felt or behaved a certain way. Still, you may feel overwhelmed about the treatment ahead. You may also wonder how to get the support you need and how to deal with the condition day-to-day. With treatment and support, you can live with ADHD and manage your symptoms. How to manage lifestyle changes Managing stress Stress is your body's reaction to life changes and events, both good and bad. To cope with the stress of an ADHD diagnosis, it may help to: Learn more about ADHD. Exercise regularly. Even a short daily walk can lower stress levels. Participate in training or education programs (including social skills training classes) that teach you to deal with symptoms.  Medicines Your health care provider may suggest certain medicines if he or she feels that they will help to improve your condition. Stimulant medicines are usually prescribed to treat ADHD, and therapy may also be prescribed. It is important to: Avoid using alcohol and other substances that may prevent your medicines from working properly (may interact). Talk with your pharmacist or health care provider about all the medicines that you take, their possible side effects, and what medicines are safe to take together. Make it your goal to take part in all treatment decisions (shared decision-making). Ask about possible side effects of medicines that your health care provider recommends, and tell him or her how you feel about having those side effects. It is best if shared decision-making with your health care provider is part of your total treatment plan. Relationships To strengthen your relationships with family members while treating your condition, consider taking part in family therapy. You might also attend self-help groups alone or with a loved one. Be  honest about how your symptoms affect your relationships. Make an effort to communicate respectfully instead of fighting, and find ways to show others that you care. Psychotherapy may be useful in helping you cope with how ADHD affects your relationships. How to recognize changes in your condition The following signs may mean that your treatment is working well and your condition is improving: Consistently being on time for appointments. Being more organized at home and work. Other people noticing improvements in your behavior. Achieving goals that you set for yourself. Thinking more clearly. The following signs may mean that your treatment is not working very well: Feeling impatience or more confusion. Missing, forgetting, or being late for appointments. An increasing sense of disorganization and messiness. More difficulty in reaching goals that you set for yourself. Loved ones becoming angry or frustrated with you. Follow these instructions at home: Take over-the-counter and prescription medicines only as told by your health care provider. Check with your health care provider before taking any new medicines. Create structure and an organized atmosphere at home. For example: Make a list of tasks, then rank them from most important to least important. Work on one task at a time until your listed tasks are done. Make a daily schedule and follow it consistently every day. Use an appointment calendar, and check it 2 or 3 times a day to keep on track. Keep it with you when you leave the house. Create spaces where you keep certain things, and always put things back in their places after you use them. Keep all follow-up visits as told by your health care provider. This is important. Where to find support Talking to others    Keep emotion out of important discussions and speak in a calm, logical way. Listen closely and patiently to your loved ones. Try to understand their point of view, and try to  avoid getting defensive. Take responsibility for the consequences of your actions. Ask that others do not take your behaviors personally. Aim to solve problems as they come up, and express your feelings instead of bottling them up. Talk openly about what you need from your loved ones and how they can support you. Consider going to family therapy sessions or having your family meet with a specialist who deals with ADHD-related behavior problems. Finances Not all insurance plans cover mental health care, so it is important to check with your insurance carrier. If paying for co-pays or counseling services is a problem, search for a local or county mental health care center. Public mental health care services may be offered there at a low cost or no cost when you are not able to see a private health care provider. If you are taking medicine for ADHD, you may be able to get the generic form, which may be less expensive than brand-name medicine. Some makers of prescription medicines also offer help to patients who cannot afford the medicines that they need. Questions to ask your health care provider: What are the risks and benefits of taking medicines? Would I benefit from therapy? How often should I follow up with a health care provider? Contact a health care provider if: You have side effects from your medicines, such as: Repeated muscle twitches, coughing, or speech outbursts. Sleep problems. Loss of appetite. Depression. New or worsening behavior problems. Dizziness. Unusually fast heartbeat. Stomach pains. Headaches. Get help right away if: You have a severe reaction to a medicine. Your behavior suddenly gets worse. Summary With treatment and support, you can live with ADHD and manage your symptoms. The medicines that are most often prescribed for ADHD are stimulants. Consider taking part in family therapy or self-help groups with family members or friends. When you talk with friends  and family about your ADHD, be patient and communicate openly. Take over-the-counter and prescription medicines only as told by your health care provider. Check with your health care provider before taking any new medicines. This information is not intended to replace advice given to you by your health care provider. Make sure you discuss any questions you have with your health care provider. Document Revised: 04/04/2020 Document Reviewed: 05/07/2020 Elsevier Patient Education  2023 [employer] Inc.  

## 2022-04-02 ENCOUNTER — Ambulatory Visit: Payer: BC Managed Care – PPO | Admitting: Nurse Practitioner

## 2022-04-02 DIAGNOSIS — I10 Essential (primary) hypertension: Secondary | ICD-10-CM

## 2022-04-02 DIAGNOSIS — R7989 Other specified abnormal findings of blood chemistry: Secondary | ICD-10-CM

## 2022-04-02 DIAGNOSIS — E7800 Pure hypercholesterolemia, unspecified: Secondary | ICD-10-CM

## 2022-04-02 DIAGNOSIS — F900 Attention-deficit hyperactivity disorder, predominantly inattentive type: Secondary | ICD-10-CM

## 2022-04-04 NOTE — Patient Instructions (Signed)

## 2022-04-09 ENCOUNTER — Ambulatory Visit: Payer: BC Managed Care – PPO | Admitting: Nurse Practitioner

## 2022-04-09 ENCOUNTER — Encounter: Payer: Self-pay | Admitting: Nurse Practitioner

## 2022-04-09 VITALS — BP 101/67 | HR 80 | Temp 98.4°F | Wt 174.2 lb

## 2022-04-09 DIAGNOSIS — N393 Stress incontinence (female) (male): Secondary | ICD-10-CM

## 2022-04-09 DIAGNOSIS — I10 Essential (primary) hypertension: Secondary | ICD-10-CM | POA: Diagnosis not present

## 2022-04-09 DIAGNOSIS — F900 Attention-deficit hyperactivity disorder, predominantly inattentive type: Secondary | ICD-10-CM

## 2022-04-09 NOTE — Progress Notes (Signed)
? ?BP 101/67   Pulse 80   Temp 98.4 ?F (36.9 ?C) (Oral)   Wt 174 lb 3.2 oz (79 kg)   SpO2 99%   BMI 31.15 kg/m?   ? ?Subjective:  ? ? Patient ID: Karen Mosley, female    DOB: 17-Dec-1980, 41 y.o.   MRN: [id number] ? ?HPI: ?Karen Mosley is a 41 y.o. female ? ?Chief Complaint  ?Patient presents with  ? Medication Refill  ?  Patient is here for medication refill.   ? Pessary Check  ?  Patient says she is not sure if she needs to follow up on her Pessary as she is still having leakage and she does not have to be moving. Patient is not sure if she needs to follow up Urology.   ? ?PESSARY IN PLACE: ?This has been in place for 3 years, was placed by urology at [facility].  Started to have leakage a few weeks back.  She is a Grade 4, they were trying pessary before surgery.  History of vaginal birth x 2.  Has a #3 incontinence ring pessary in place, continues to remove and clean every 1-2 days.  Currently is leaking daily and this is progressively getting worse.  ? ?ADD FOLLOW UP ?Stopped taking Vyvanse 30 MG daily two months ago.  Reports doing well without this at this time.   She is using a lot of mindfulness at work to reset. ? ?Took Adderall XR 20 MG for several years -- then had bad side effects with this and at previous clinic they tried Wellbutrin, but had an anaphylactic reaction to this. She was diagnosed in 2016 to 2017 with ADD -- initially tried to treat herself at home.  PDMP review last fill 10/02/21. ?ADHD status: controlled ?Satisfied with current therapy: no ?Medication compliance:  good compliance ?Controlled substance contract: no ?Previous psychiatry evaluation: yes ?Previous medications: yes adderall, adderall XR, and wellbutrin   ?Taking meds on weekends/vacations: no ?Work/school performance:  good ?Difficulty sustaining attention/completing tasks: no ?Distracted by extraneous stimuli: no ?Does not listen when spoken to: no  ?Fidgets with hands or feet: no ?Unable to stay in seat: no ?Blurts  out/interrupts others: no ?ADHD Medication Side Effects: yes ?   Decreased appetite: yes ?   Headache: yes ?   Sleeping disturbance pattern:  if took too late in day ?   Irritability: yes ?   Rebound effects (worse than baseline) off medication: no ?   Anxiousness: no ?   Dizziness: no ?   Tics: no ?  ?HYPERTENSION ?Continues on Losartan-HCTZ -- tried to go off this recently and was good for a month, then started to trend up to 160/100 range.  Has a history of preeclampsia with both pregnancies. ?  ?History of mother having an ablation in past -- atrial fibrillation. ?Hypertension status: controlled  ?Satisfied with current treatment? yes ?Duration of hypertension: chronic ?BP monitoring frequency:  not checking ?BP range:  ?BP medication side effects:  yes ?Medication compliance: good compliance ?Previous BP meds: Losartan, Lisinopril ?Aspirin: no ?Recurrent headaches: none ?Visual changes: no ?Palpitations:  occasional -- every 6 months or so for some time ?Dyspnea: no ?Chest pain: no ?Lower extremity edema: no ?Dizzy/lightheaded: no  ?  ?Relevant past medical, surgical, family and social history reviewed and updated as indicated. Interim medical history since our last visit reviewed. ?Allergies and medications reviewed and updated. ? ?Review of Systems  ?Constitutional:  Negative for activity change, appetite change, diaphoresis, fatigue and fever.  ?  Respiratory:  Negative for cough, chest tightness and shortness of breath.   ?Cardiovascular:  Negative for chest pain, palpitations and leg swelling.  ?Gastrointestinal: Negative.   ?Neurological: Negative.   ?Psychiatric/Behavioral:  Negative for decreased concentration, self-injury, sleep disturbance and suicidal ideas. The patient is not nervous/anxious.   ? ?Per HPI unless specifically indicated above ? ?   ?Objective:  ?  ?BP 101/67   Pulse 80   Temp 98.4 ?F (36.9 ?C) (Oral)   Wt 174 lb 3.2 oz (79 kg)   SpO2 99%   BMI 31.15 kg/m?   ?Wt Readings from Last 3  Encounters:  ?04/09/22 174 lb 3.2 oz (79 kg)  ?09/21/21 151 lb 3.2 oz (68.6 kg)  ?07/29/21 147 lb 6.4 oz (66.9 kg)  ?  ?Physical Exam ?Vitals and nursing note reviewed.  ?Constitutional:   ?   General: She is awake. She is not in acute distress. ?   Appearance: She is well-developed and well-groomed. She is not ill-appearing or toxic-appearing.  ?HENT:  ?   Head: Normocephalic.  ?   Right Ear: Hearing, tympanic membrane, ear canal and external ear normal.  ?   Left Ear: Hearing, tympanic membrane, ear canal and external ear normal.  ?   Nose: Nose normal.  ?Eyes:  ?   General: Lids are normal.     ?   Right eye: No discharge.     ?   Left eye: No discharge.  ?   Conjunctiva/sclera: Conjunctivae normal.  ?   Pupils: Pupils are equal, round, and reactive to light.  ?Neck:  ?   Thyroid: No thyromegaly.  ?   Vascular: No carotid bruit.  ?Cardiovascular:  ?   Rate and Rhythm: Normal rate and regular rhythm.  ?   Heart sounds: Normal heart sounds. No murmur heard. ?  No gallop.  ?Pulmonary:  ?   Effort: Pulmonary effort is normal. No accessory muscle usage or respiratory distress.  ?   Breath sounds: Normal breath sounds.  ?Abdominal:  ?   General: Bowel sounds are normal.  ?   Palpations: Abdomen is soft.  ?Musculoskeletal:  ?   Cervical back: Normal range of motion and neck supple.  ?   Right lower leg: No edema.  ?   Left lower leg: No edema.  ?Lymphadenopathy:  ?   Cervical: No cervical adenopathy.  ?Skin: ?   General: Skin is warm and dry.  ?Neurological:  ?   Mental Status: She is alert and oriented to person, place, and time.  ?Psychiatric:     ?   Attention and Perception: Attention normal.     ?   Mood and Affect: Mood normal.     ?   Speech: Speech normal.     ?   Behavior: Behavior normal. Behavior is cooperative.     ?   Thought Content: Thought content normal.  ? ?Results for orders placed or performed in visit on 09/21/21  ?Urine drugs of abuse scrn w alc, routine (Ref Lab)  ?Result Value Ref Range  ?  Amphetamines, Urine Negative Cutoff=1000 ng/mL  ? Barbiturate Quant, Ur Negative Cutoff=300 ng/mL  ? Benzodiazepine Quant, Ur Negative Cutoff=300 ng/mL  ? Cannabinoid Quant, Ur Negative Cutoff=50 ng/mL  ? Cocaine (Metab.) Negative Cutoff=300 ng/mL  ? Opiate Quant, Ur Negative Cutoff=300 ng/mL  ? PCP Quant, Ur Negative Cutoff=25 ng/mL  ? Methadone Screen, Urine Negative Cutoff=300 ng/mL  ? Propoxyphene Negative Cutoff=300 ng/mL  ? Ethanol, Urine Negative Cutoff=0.020 %  ?  CBC with Differential/Platelet  ?Result Value Ref Range  ? WBC 7.6 3.4 - 10.8 x10E3/uL  ? RBC 4.64 3.77 - 5.28 x10E6/uL  ? Hemoglobin 14.0 11.1 - 15.9 g/dL  ? Hematocrit 40.5 34.0 - 46.6 %  ? MCV 87 79 - 97 fL  ? MCH 30.2 26.6 - 33.0 pg  ? MCHC 34.6 31.5 - 35.7 g/dL  ? RDW 12.2 11.7 - 15.4 %  ? Platelets 306 150 - 450 x10E3/uL  ? Neutrophils 58 Not Estab. %  ? Lymphs 33 Not Estab. %  ? Monocytes 6 Not Estab. %  ? Eos 2 Not Estab. %  ? Basos 1 Not Estab. %  ? Neutrophils Absolute 4.4 1.4 - 7.0 x10E3/uL  ? Lymphocytes Absolute 2.5 0.7 - 3.1 x10E3/uL  ? Monocytes Absolute 0.4 0.1 - 0.9 x10E3/uL  ? EOS (ABSOLUTE) 0.2 0.0 - 0.4 x10E3/uL  ? Basophils Absolute 0.0 0.0 - 0.2 x10E3/uL  ? Immature Granulocytes 0 Not Estab. %  ? Immature Grans (Abs) 0.0 0.0 - 0.1 x10E3/uL  ?Comprehensive metabolic panel  ?Result Value Ref Range  ? Glucose 83 70 - 99 mg/dL  ? BUN 21 (H) 6 - 20 mg/dL  ? Creatinine, Ser 0.77 0.57 - 1.00 mg/dL  ? eGFR 101 >59 mL/min/1.73  ? BUN/Creatinine Ratio 27 (H) 9 - 23  ? Sodium 138 134 - 144 mmol/L  ? Potassium 3.8 3.5 - 5.2 mmol/L  ? Chloride 98 96 - 106 mmol/L  ? CO2 26 20 - 29 mmol/L  ? Calcium 9.7 8.7 - 10.2 mg/dL  ? Total Protein 7.0 6.0 - 8.5 g/dL  ? Albumin 5.0 (H) 3.8 - 4.8 g/dL  ? Globulin, Total 2.0 1.5 - 4.5 g/dL  ? Albumin/Globulin Ratio 2.5 (H) 1.2 - 2.2  ? Bilirubin Total 0.4 0.0 - 1.2 mg/dL  ? Alkaline Phosphatase 64 44 - 121 IU/L  ? AST 27 0 - 40 IU/L  ? ALT 37 (H) 0 - 32 IU/L  ?Lipid Panel w/o Chol/HDL Ratio  ?Result Value  Ref Range  ? Cholesterol, Total 225 (H) 100 - 199 mg/dL  ? Triglycerides 209 (H) 0 - 149 mg/dL  ? HDL 51 >39 mg/dL  ? VLDL Cholesterol Cal 37 5 - 40 mg/dL  ? LDL Chol Calc (NIH) 137 (H) 0 - 99 mg/dL  ?TSH  ?Result Val

## 2022-04-09 NOTE — Assessment & Plan Note (Signed)
Previously followed by Karen Mosley with pessary in place, however for past weeks has been leaking more with worsening.  Will get her back into [facility] for assessment and recommendations. ?

## 2022-04-09 NOTE — Assessment & Plan Note (Signed)
Chronic, stable without medication.  She wishes to continue without medication at this time, tolerated Vyvanse in past.  Will continue to monitor. ?

## 2022-04-09 NOTE — Assessment & Plan Note (Signed)
Chronic, ongoing with BP at goal in office today.  Tried without medication and BP trended up.  Recommend she monitor BP at least a few mornings a week at home and document.  DASH diet at home.  Continue current medication regimen and adjust as needed.  Labs at physical.  Return in 6 months. ?

## 2022-04-28 IMAGING — US US ABDOMEN LIMITED
1 series · 14 of 25 positions shown · non-contrast
Comparison: None.

CLINICAL DATA: Elevated liver enzymes

EXAM:
ULTRASOUND ABDOMEN LIMITED RIGHT UPPER QUADRANT

[Series 1: us abdomen limited · 0.23mm/px · 14 of 49 slices shown]
[im 1/49]
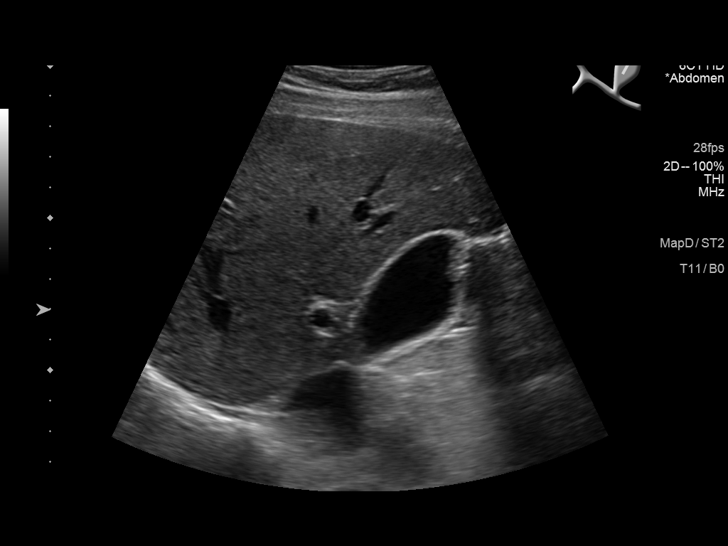
[im 5/49]
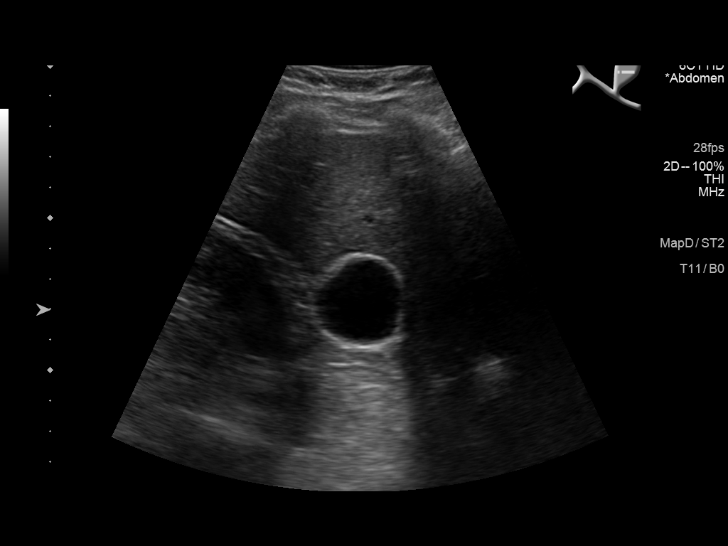
[im 9/49]
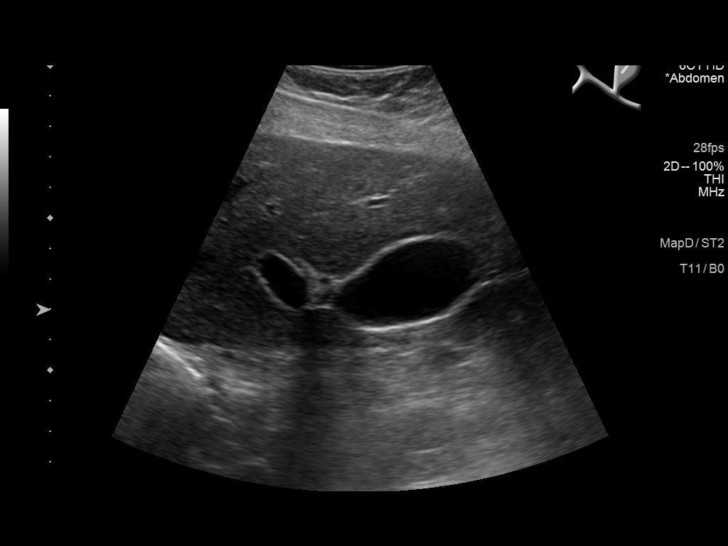
[im 13/49]
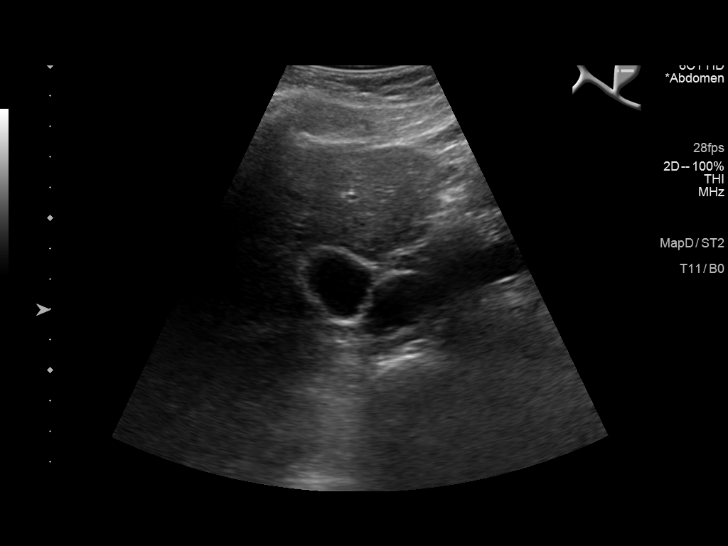
[im 17/49]
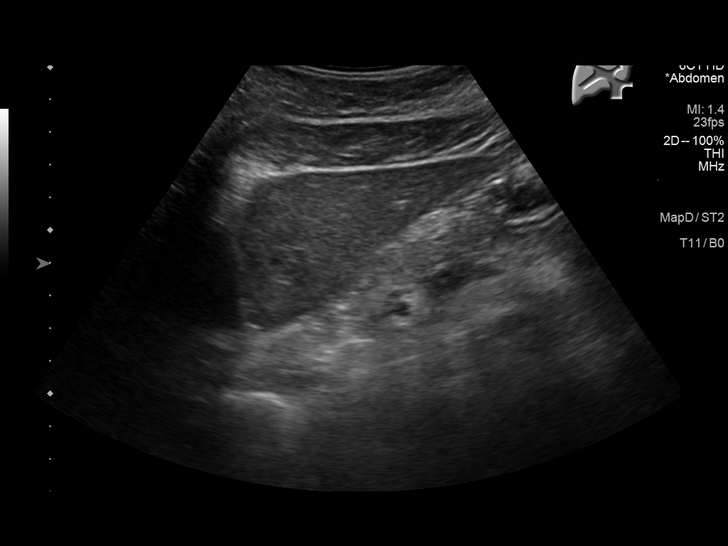
[im 19/49]
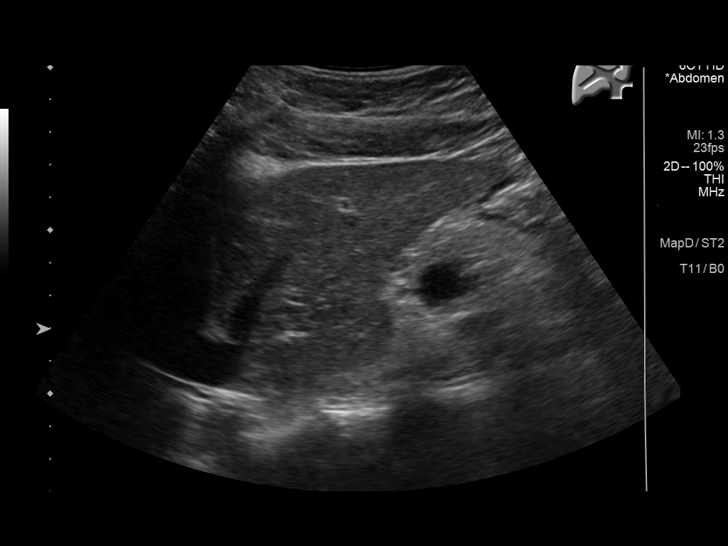
[im 23/49]
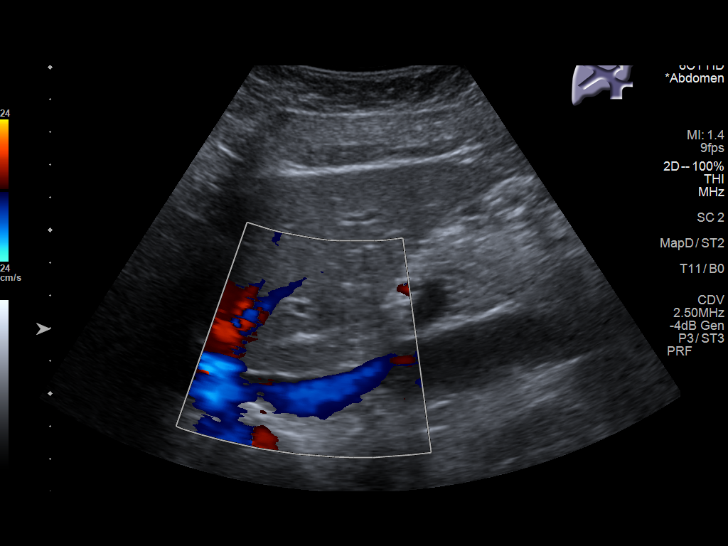
[im 27/49]
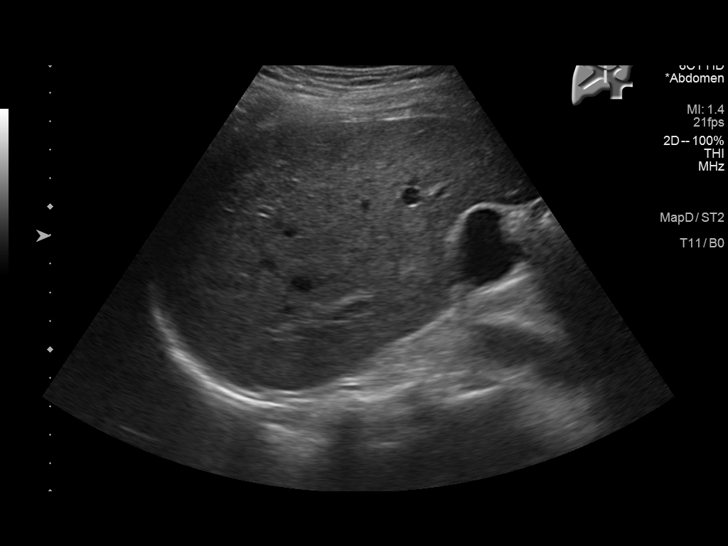
[im 31/49]
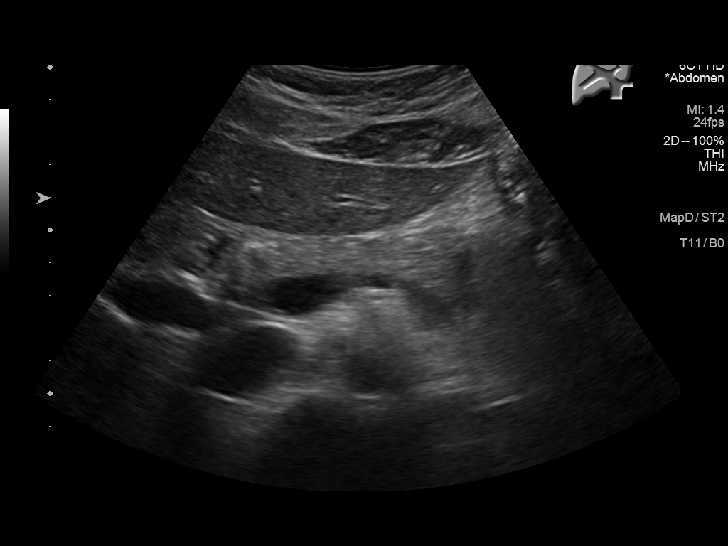
[im 33/49]
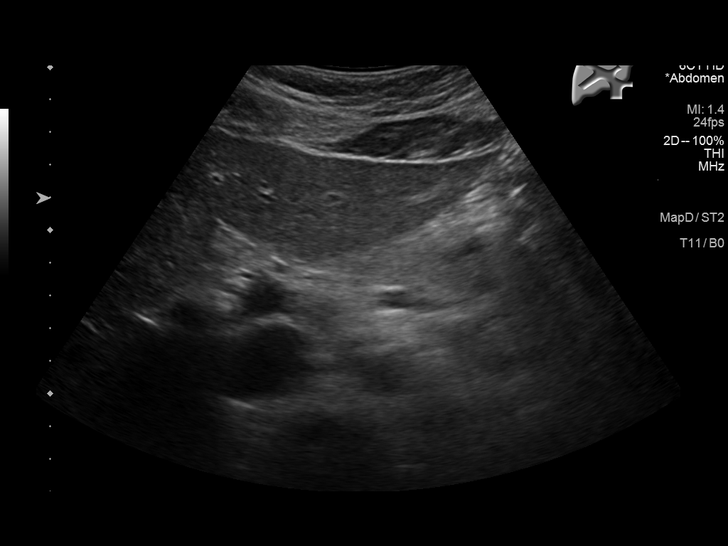
[im 37/49]
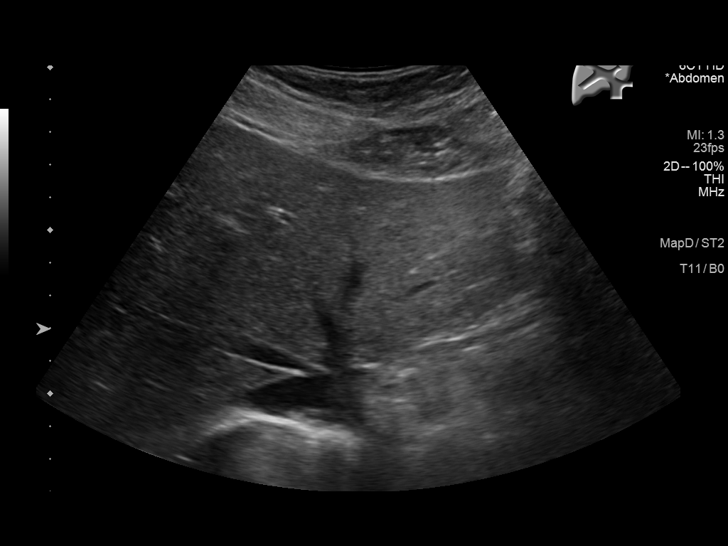
[im 41/49]
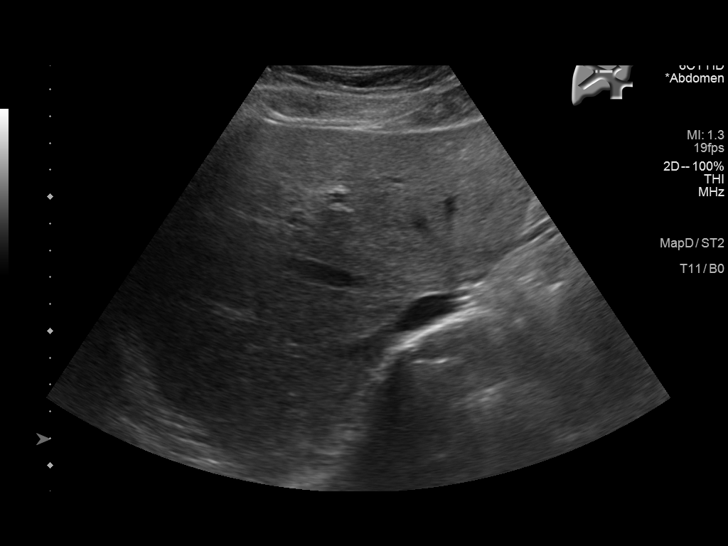
[im 45/49]
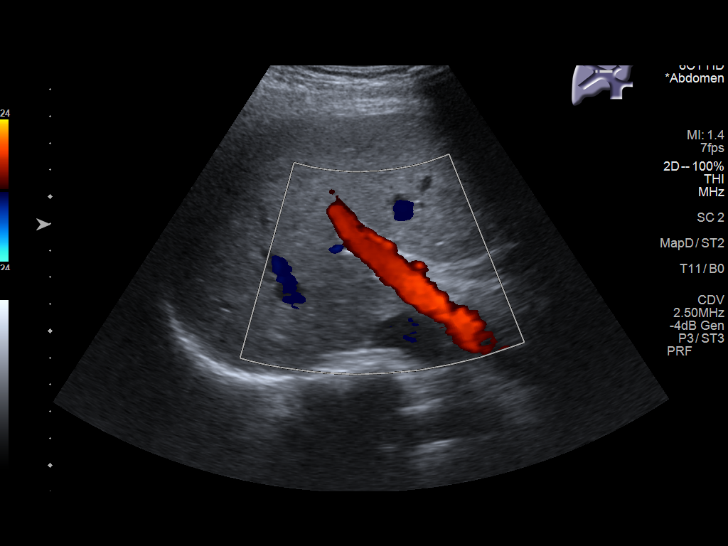
[im 49/49]
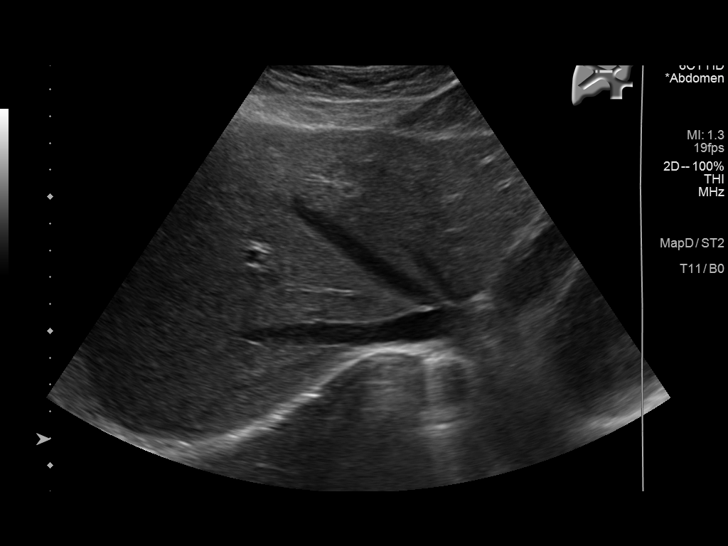

[14 of 25 positions shown; findings below may reference images not displayed]

FINDINGS: Gallbladder:

No gallstones or wall thickening visualized. There is no
pericholecystic fluid no sonographic Murphy sign noted by
sonographer.

Common bile duct:

Diameter: 2 mm. No intrahepatic or extrahepatic biliary duct
dilatation

Liver:

No focal lesion identified. Within normal limits in parenchymal
echogenicity. Portal vein is patent on color Doppler imaging with
normal direction of blood flow towards the liver.

Other: Pancreas appears normal.
IMPRESSION: Study within normal limits

## 2022-09-06 ENCOUNTER — Other Ambulatory Visit: Payer: Self-pay | Admitting: Nurse Practitioner

## 2022-09-07 NOTE — Telephone Encounter (Signed)
Requested medication (s) are due for refill today: Yes  Requested medication (s) are on the active medication list: Yes  Last refill:  07/29/22  Future visit scheduled: No  Notes to clinic:  Prescription expired. Protocol indicates lab work needed.    Requested Prescriptions  Pending Prescriptions Disp Refills   losartan-hydrochlorothiazide (HYZAAR) 50-12.5 MG tablet [Pharmacy Med Name: LOSARTAN-HCTZ 50-12.5 MG TAB] 90 tablet 3    Sig: TAKE 1 TABLET BY MOUTH EVERY DAY     Cardiovascular: ARB + Diuretic Combos Failed - 09/06/2022 11:48 AM      Failed - K in normal range and within 180 days    Potassium  Date Value Ref Range Status  09/21/2021 3.8 3.5 - 5.2 mmol/L Final         Failed - Na in normal range and within 180 days    Sodium  Date Value Ref Range Status  09/21/2021 138 134 - 144 mmol/L Final         Failed - Cr in normal range and within 180 days    Creatinine, Ser  Date Value Ref Range Status  09/21/2021 0.77 0.57 - 1.00 mg/dL Final   Creatinine, Urine  Date Value Ref Range Status  06/11/2018 121 mg/dL Final         Failed - eGFR is 10 or above and within 180 days    GFR calc Af Amer  Date Value Ref Range Status  06/12/2018 >60 >60 mL/min Final    Comment:    (NOTE) The eGFR has been calculated using the CKD EPI equation. This calculation has not been validated in all clinical situations. eGFR's persistently <60 mL/min signify possible Chronic Kidney Disease.    GFR, Estimated  Date Value Ref Range Status  04/22/2021 >60 >60 mL/min Final    Comment:    (NOTE) Calculated using the CKD-EPI Creatinine Equation (2021)    eGFR  Date Value Ref Range Status  09/21/2021 101 >59 mL/min/1.73 Final         Passed - Patient is not pregnant      Passed - Last BP in normal range    BP Readings from Last 1 Encounters:  04/09/22 101/67         Passed - Valid encounter within last 6 months    Recent Outpatient Visits           5 months ago Essential  hypertension   [place], Barbaraann Faster, NP   11 months ago Essential hypertension   [place], Lauren A, NP   1 year ago Encounter to establish care   [facility], Barbaraann Faster, NP

## 2022-09-07 NOTE — Telephone Encounter (Signed)
Pt scheduled an appt for  cpe oct 27th,  She would like one month supply of her prescription

## 2022-09-07 NOTE — Telephone Encounter (Signed)
LVM asking patient to call back to schedule her follow up appointment. Jolene wanted patient to schedule physical (around 10/06/2022).

## 2022-09-26 NOTE — Patient Instructions (Incomplete)

## 2022-10-01 ENCOUNTER — Encounter: Payer: BC Managed Care – PPO | Admitting: Nurse Practitioner

## 2022-10-01 DIAGNOSIS — Z23 Encounter for immunization: Secondary | ICD-10-CM

## 2022-10-01 DIAGNOSIS — Z124 Encounter for screening for malignant neoplasm of cervix: Secondary | ICD-10-CM

## 2022-10-01 DIAGNOSIS — E7800 Pure hypercholesterolemia, unspecified: Secondary | ICD-10-CM

## 2022-10-01 DIAGNOSIS — I10 Essential (primary) hypertension: Secondary | ICD-10-CM

## 2022-10-01 DIAGNOSIS — Z0000 Encounter for general adult medical examination without abnormal findings: Secondary | ICD-10-CM

## 2022-10-01 DIAGNOSIS — E559 Vitamin D deficiency, unspecified: Secondary | ICD-10-CM

## 2022-10-01 DIAGNOSIS — F900 Attention-deficit hyperactivity disorder, predominantly inattentive type: Secondary | ICD-10-CM

## 2022-10-17 NOTE — Patient Instructions (Signed)

## 2022-10-22 ENCOUNTER — Encounter: Payer: Self-pay | Admitting: Nurse Practitioner

## 2022-10-22 ENCOUNTER — Ambulatory Visit (INDEPENDENT_AMBULATORY_CARE_PROVIDER_SITE_OTHER): Payer: BC Managed Care – PPO | Admitting: Nurse Practitioner

## 2022-10-22 VITALS — BP 127/88 | HR 94 | Temp 98.5°F | Ht 62.72 in | Wt 149.6 lb

## 2022-10-22 DIAGNOSIS — I10 Essential (primary) hypertension: Secondary | ICD-10-CM

## 2022-10-22 DIAGNOSIS — F900 Attention-deficit hyperactivity disorder, predominantly inattentive type: Secondary | ICD-10-CM

## 2022-10-22 DIAGNOSIS — Z0000 Encounter for general adult medical examination without abnormal findings: Secondary | ICD-10-CM | POA: Diagnosis not present

## 2022-10-22 DIAGNOSIS — N393 Stress incontinence (female) (male): Secondary | ICD-10-CM | POA: Diagnosis not present

## 2022-10-22 DIAGNOSIS — E7800 Pure hypercholesterolemia, unspecified: Secondary | ICD-10-CM

## 2022-10-22 DIAGNOSIS — E559 Vitamin D deficiency, unspecified: Secondary | ICD-10-CM

## 2022-10-22 DIAGNOSIS — Z23 Encounter for immunization: Secondary | ICD-10-CM | POA: Diagnosis not present

## 2022-10-22 DIAGNOSIS — Z124 Encounter for screening for malignant neoplasm of cervix: Secondary | ICD-10-CM

## 2022-10-22 NOTE — Assessment & Plan Note (Signed)
Chronic, stable with BP at goal today in office.  Recommend she monitor BP at least a few mornings a week at home and document.  DASH diet at home.  Continue current medication regimen and adjust as needed.  Labs: CMP, CBC, TSH.  Return in 6 months.

## 2022-10-22 NOTE — Assessment & Plan Note (Signed)
Noted on past labs.  Check lipid panel today.

## 2022-10-22 NOTE — Assessment & Plan Note (Signed)
New referral to uro gynecology placed per patient request.

## 2022-10-22 NOTE — Progress Notes (Signed)
BP 127/88   Pulse 94   Temp 98.5 F (36.9 C) (Oral)   Ht 5' 2.72" (1.593 m)   Wt 149 lb 9 oz (67.8 kg)   SpO2 97%   BMI 26.73 kg/m    Subjective:    Patient ID: Karen Mosley, female    DOB: February 06, 1981, 41 y.o.   MRN: [id number]  HPI: Karen Mosley is a 41 y.o. female presenting on 10/22/2022 for comprehensive medical examination. Current medical complaints include:none  She currently lives with: spouse Menopausal Symptoms: no  HYPERTENSION  Continues on Hyzaar daily.  Has pessary in place and would like new uro-gyn referral placed. Hypertension status: stable  Satisfied with current treatment? yes Duration of hypertension: chronic BP monitoring frequency:  not checking BP range:  BP medication side effects:  no Medication compliance: good compliance Aspirin: no Recurrent headaches: no Visual changes: no Palpitations: no Dyspnea: no Chest pain: no Lower extremity edema: no Dizzy/lightheaded: no      10/22/2022    3:28 PM 04/09/2022    3:58 PM 07/29/2021    8:16 AM 07/28/2018   10:12 AM  Depression screen PHQ 2/9  Decreased Interest 0 0 0 0  Down, Depressed, Hopeless 0 0 0 0  PHQ - 2 Score 0 0 0 0  Altered sleeping 0 0  0  Tired, decreased energy 0 0  0  Change in appetite 0 0  0  Feeling bad or failure about yourself  0 0  0  Trouble concentrating 0 0  0  Moving slowly or fidgety/restless 0 0  0  Suicidal thoughts 0 0  0  PHQ-9 Score 0 0  0  Difficult doing work/chores Not difficult at all   Not difficult at all    The patient does not have a history of falls. I did not complete a risk assessment for falls. A plan of care for falls was not documented.   Past Medical History:  Past Medical History:  Diagnosis Date   Hypertension    Pessary maintenance    Preeclampsia     Surgical History:  Past Surgical History:  Procedure Laterality Date   FRACTURE SURGERY Right    arm   WISDOM TOOTH EXTRACTION      Medications:  Current Outpatient  Medications on File Prior to Visit  Medication Sig   losartan-hydrochlorothiazide (HYZAAR) 50-12.5 MG tablet TAKE 1 TABLET BY MOUTH EVERY DAY   Nutritional Supplements (ADRENAL COMPLEX PO) Take by mouth.   VITAMIN D, CHOLECALCIFEROL, PO Take by mouth.   No current facility-administered medications on file prior to visit.    Allergies:  Allergies  Allergen Reactions   Antihistamines, Chlorpheniramine-Type Anaphylaxis   Lobster [Shellfish Allergy] Anaphylaxis   Penicillins Nausea And Vomiting and Rash   Wellbutrin [Bupropion] Anaphylaxis   Benadryl Allergy [Diphenhydramine Hcl] Swelling   Red Dye Swelling    Red 44     Social History:  Social History   Socioeconomic History   Marital status: Married    Spouse name: Not on file   Number of children: Not on file   Years of education: Not on file   Highest education level: Not on file  Occupational History   Not on file  Tobacco Use   Smoking status: Former   Smokeless tobacco: Never  Vaping Use   Vaping Use: Never used  Substance and Sexual Activity   Alcohol use: Yes    Comment: occass   Drug use: Never   Sexual  activity: Yes    Birth control/protection: Condom  Other Topics Concern   Not on file  Social History Narrative   Not on file   Social Determinants of Health   Financial Resource Strain: Low Risk  (07/29/2021)   Overall Financial Resource Strain (CARDIA)    Difficulty of Paying Living Expenses: Not hard at all  Food Insecurity: No Food Insecurity (07/29/2021)   Hunger Vital Sign    Worried About Running Out of Food in the Last Year: Never true    [place] in the Last Year: Never true  Transportation Needs: No Transportation Needs (07/29/2021)   PRAPARE - Hydrologist (Medical): No    Lack of Transportation (Non-Medical): No  Physical Activity: Insufficiently Active (07/29/2021)   Exercise Vital Sign    Days of Exercise per Week: 2 days    Minutes of Exercise per  Session: 50 min  Stress: Stress Concern Present (07/29/2021)   Todd    Feeling of Stress : To some extent  Social Connections: Moderately Integrated (07/29/2021)   Social Connection and Isolation Panel [NHANES]    Frequency of Communication with Friends and Family: More than three times a week    Frequency of Social Gatherings with Friends and Family: More than three times a week    Attends Religious Services: More than 4 times per year    Active Member of [employer] or Organizations: No    Attends Archivist Meetings: Never    Marital Status: Married  Human resources officer Violence: Not At Risk (07/29/2021)   Humiliation, Afraid, Rape, and Kick questionnaire    Fear of Current or Ex-Partner: No    Emotionally Abused: No    Physically Abused: No    Sexually Abused: No   Social History   Tobacco Use  Smoking Status Former  Smokeless Tobacco Never   Social History   Substance and Sexual Activity  Alcohol Use Yes   Comment: occass    Family History:  Family History  Problem Relation Age of Onset   Hypertension Mother    Hypertension Father    Heart attack Father    Asthma Son    ADD / ADHD Son    Hypertension Maternal Aunt    Kidney disease Maternal Grandmother    Stroke Maternal Grandmother    Breast cancer Maternal Grandmother        with recurrence   Hypertension Maternal Grandmother    Pulmonary embolism Maternal Grandfather    Congestive Heart Failure Maternal Grandfather    Pulmonary embolism Paternal Grandfather    Hypertension Paternal Grandfather     Past medical history, surgical history, medications, allergies, family history and social history reviewed with patient today and changes made to appropriate areas of the chart.   ROS All other ROS negative except what is listed above and in the HPI.      Objective:    BP 127/88   Pulse 94   Temp 98.5 F (36.9 C) (Oral)   Ht 5' 2.72"  (1.593 m)   Wt 149 lb 9 oz (67.8 kg)   SpO2 97%   BMI 26.73 kg/m   Wt Readings from Last 3 Encounters:  10/22/22 149 lb 9 oz (67.8 kg)  04/09/22 174 lb 3.2 oz (79 kg)  09/21/21 151 lb 3.2 oz (68.6 kg)    Physical Exam Vitals and nursing note reviewed.  Constitutional:  General: She is awake. She is not in acute distress.    Appearance: She is well-developed and well-groomed. She is not ill-appearing or toxic-appearing.  HENT:     Head: Normocephalic and atraumatic.     Right Ear: Hearing, tympanic membrane, ear canal and external ear normal. No drainage.     Left Ear: Hearing, tympanic membrane, ear canal and external ear normal. No drainage.     Nose: Nose normal.     Right Sinus: No maxillary sinus tenderness or frontal sinus tenderness.     Left Sinus: No maxillary sinus tenderness or frontal sinus tenderness.     Mouth/Throat:     Mouth: Mucous membranes are moist.     Pharynx: Oropharynx is clear. Uvula midline. No pharyngeal swelling, oropharyngeal exudate or posterior oropharyngeal erythema.  Eyes:     General: Lids are normal.        Right eye: No discharge.        Left eye: No discharge.     Extraocular Movements: Extraocular movements intact.     Conjunctiva/sclera: Conjunctivae normal.     Pupils: Pupils are equal, round, and reactive to light.     Visual Fields: Right eye visual fields normal and left eye visual fields normal.  Neck:     Thyroid: No thyromegaly.     Vascular: No carotid bruit.     Trachea: Trachea normal.  Cardiovascular:     Rate and Rhythm: Normal rate and regular rhythm.     Heart sounds: Normal heart sounds. No murmur heard.    No gallop.  Pulmonary:     Effort: Pulmonary effort is normal. No accessory muscle usage or respiratory distress.     Breath sounds: Normal breath sounds.  Chest:  Breasts:    Right: Normal.     Left: Normal.  Abdominal:     General: Bowel sounds are normal.     Palpations: Abdomen is soft. There is no  hepatomegaly or splenomegaly.     Tenderness: There is no abdominal tenderness.  Musculoskeletal:        General: Normal range of motion.     Cervical back: Normal range of motion and neck supple.     Right lower leg: No edema.     Left lower leg: No edema.  Lymphadenopathy:     Head:     Right side of head: No submental, submandibular, tonsillar, preauricular or posterior auricular adenopathy.     Left side of head: No submental, submandibular, tonsillar, preauricular or posterior auricular adenopathy.     Cervical: No cervical adenopathy.     Upper Body:     Right upper body: No supraclavicular, axillary or pectoral adenopathy.     Left upper body: No supraclavicular, axillary or pectoral adenopathy.  Skin:    General: Skin is warm and dry.     Capillary Refill: Capillary refill takes less than 2 seconds.     Findings: No rash.  Neurological:     Mental Status: She is alert and oriented to person, place, and time.     Gait: Gait is intact.     Deep Tendon Reflexes: Reflexes are normal and symmetric.     Reflex Scores:      Brachioradialis reflexes are 2+ on the right side and 2+ on the left side.      Patellar reflexes are 2+ on the right side and 2+ on the left side. Psychiatric:        Attention and Perception: Attention normal.  Mood and Affect: Mood normal.        Speech: Speech normal.        Behavior: Behavior normal. Behavior is cooperative.        Thought Content: Thought content normal.        Judgment: Judgment normal.     Results for orders placed or performed in visit on 09/21/21  Urine drugs of abuse scrn w alc, routine (Ref Lab)  Result Value Ref Range   Amphetamines, Urine Negative Cutoff=1000 ng/mL   Barbiturate Quant, Ur Negative Cutoff=300 ng/mL   Benzodiazepine Quant, Ur Negative Cutoff=300 ng/mL   Cannabinoid Quant, Ur Negative Cutoff=50 ng/mL   Cocaine (Metab.) Negative Cutoff=300 ng/mL   Opiate Quant, Ur Negative Cutoff=300 ng/mL   PCP Quant,  Ur Negative Cutoff=25 ng/mL   Methadone Screen, Urine Negative Cutoff=300 ng/mL   Propoxyphene Negative Cutoff=300 ng/mL   Ethanol, Urine Negative Cutoff=0.020 %  CBC with Differential/Platelet  Result Value Ref Range   WBC 7.6 3.4 - 10.8 x10E3/uL   RBC 4.64 3.77 - 5.28 x10E6/uL   Hemoglobin 14.0 11.1 - 15.9 g/dL   Hematocrit 40.5 34.0 - 46.6 %   MCV 87 79 - 97 fL   MCH 30.2 26.6 - 33.0 pg   MCHC 34.6 31.5 - 35.7 g/dL   RDW 12.2 11.7 - 15.4 %   Platelets 306 150 - 450 x10E3/uL   Neutrophils 58 Not Estab. %   Lymphs 33 Not Estab. %   Monocytes 6 Not Estab. %   Eos 2 Not Estab. %   Basos 1 Not Estab. %   Neutrophils Absolute 4.4 1.4 - 7.0 x10E3/uL   Lymphocytes Absolute 2.5 0.7 - 3.1 x10E3/uL   Monocytes Absolute 0.4 0.1 - 0.9 x10E3/uL   EOS (ABSOLUTE) 0.2 0.0 - 0.4 x10E3/uL   Basophils Absolute 0.0 0.0 - 0.2 x10E3/uL   Immature Granulocytes 0 Not Estab. %   Immature Grans (Abs) 0.0 0.0 - 0.1 x10E3/uL  Comprehensive metabolic panel  Result Value Ref Range   Glucose 83 70 - 99 mg/dL   BUN 21 (H) 6 - 20 mg/dL   Creatinine, Ser 0.77 0.57 - 1.00 mg/dL   eGFR 101 >59 mL/min/1.73   BUN/Creatinine Ratio 27 (H) 9 - 23   Sodium 138 134 - 144 mmol/L   Potassium 3.8 3.5 - 5.2 mmol/L   Chloride 98 96 - 106 mmol/L   CO2 26 20 - 29 mmol/L   Calcium 9.7 8.7 - 10.2 mg/dL   Total Protein 7.0 6.0 - 8.5 g/dL   Albumin 5.0 (H) 3.8 - 4.8 g/dL   Globulin, Total 2.0 1.5 - 4.5 g/dL   Albumin/Globulin Ratio 2.5 (H) 1.2 - 2.2   Bilirubin Total 0.4 0.0 - 1.2 mg/dL   Alkaline Phosphatase 64 44 - 121 IU/L   AST 27 0 - 40 IU/L   ALT 37 (H) 0 - 32 IU/L  Lipid Panel w/o Chol/HDL Ratio  Result Value Ref Range   Cholesterol, Total 225 (H) 100 - 199 mg/dL   Triglycerides 209 (H) 0 - 149 mg/dL   HDL 51 >39 mg/dL   VLDL Cholesterol Cal 37 5 - 40 mg/dL   LDL Chol Calc (NIH) 137 (H) 0 - 99 mg/dL  TSH  Result Value Ref Range   TSH 1.940 0.450 - 4.500 uIU/mL  Hepatitis C antibody  Result Value Ref  Range   Hep C Virus Ab <0.1 0.0 - 0.9 s/co ratio      Assessment & Plan:  Problem List Items Addressed This Visit       Cardiovascular and Mediastinum   Essential hypertension - Primary    Chronic, stable with BP at goal today in office.  Recommend she monitor BP at least a few mornings a week at home and document.  DASH diet at home.  Continue current medication regimen and adjust as needed.  Labs: CMP, CBC, TSH.  Return in 6 months.      Relevant Orders   CBC with Differential/Platelet   Comprehensive metabolic panel   TSH   Ambulatory referral to Urogynecology     Other   Elevated low density lipoprotein (LDL) cholesterol level    Noted on past labs.  Check lipid panel today.      Relevant Orders   Comprehensive metabolic panel   Lipid Panel w/o Chol/HDL Ratio   Stress incontinence in female    New referral to uro gynecology placed per patient request.      Relevant Orders   Ambulatory referral to Urogynecology   Other Visit Diagnoses     Vitamin D deficiency       History of low levels reported, check today and start supplement as needed.   Relevant Orders   VITAMIN D 25 Hydroxy (Vit-D Deficiency, Fractures)   Flu vaccine need       Flu vaccine in office today.   Relevant Orders   Flu Vaccine QUAD 6+ mos PF IM (Fluarix Quad PF)   Encounter for annual physical exam       Annual physical today with labs and health maintenance reviewed, discussed with patient.  Refuses pap today.        Follow up plan: Return in about 6 months (around 04/22/2023) for HTN.   LABORATORY TESTING:  - Pap smear:  Refused today -- will get future visit  IMMUNIZATIONS:   - Tdap: Tetanus vaccination status reviewed: last tetanus booster within 10 years. - Influenza: Administered today - Pneumovax: Not applicable - Prevnar: Not applicable - COVID: Refused - HPV: Not applicable - Shingrix vaccine: Not applicable  SCREENING: -Mammogram: Not applicable  - Colonoscopy: Not  applicable  - Bone Density: Not applicable  -Hearing Test: Not applicable  -Spirometry: Not applicable   PATIENT COUNSELING:   Advised to take 1 mg of folate supplement per day if capable of pregnancy.   Sexuality: Discussed sexually transmitted diseases, partner selection, use of condoms, avoidance of unintended pregnancy  and contraceptive alternatives.   Advised to avoid cigarette smoking.  I discussed with the patient that most people either abstain from alcohol or drink within safe limits (<=14/week and <=4 drinks/occasion for males, <=7/weeks and <= 3 drinks/occasion for females) and that the risk for alcohol disorders and other health effects rises proportionally with the number of drinks per week and how often a drinker exceeds daily limits.  Discussed cessation/primary prevention of drug use and availability of treatment for abuse.   Diet: Encouraged to adjust caloric intake to maintain  or achieve ideal body weight, to reduce intake of dietary saturated fat and total fat, to limit sodium intake by avoiding high sodium foods and not adding table salt, and to maintain adequate dietary potassium and calcium preferably from fresh fruits, vegetables, and low-fat dairy products.    stressed the importance of regular exercise  Injury prevention: Discussed safety belts, safety helmets, smoke detector, smoking near bedding or upholstery.   Dental health: Discussed importance of regular tooth brushing, flossing, and dental visits.    NEXT PREVENTATIVE PHYSICAL  DUE IN 1 YEAR. Return in about 6 months (around 04/22/2023) for HTN.

## 2022-10-23 LAB — CBC WITH DIFFERENTIAL/PLATELET
Basophils Absolute: 0.1 10*3/uL (ref 0.0–0.2)
Basos: 1 %
EOS (ABSOLUTE): 0.2 10*3/uL (ref 0.0–0.4)
Eos: 2 %
Hematocrit: 40.4 % (ref 34.0–46.6)
Hemoglobin: 13.8 g/dL (ref 11.1–15.9)
Immature Grans (Abs): 0 10*3/uL (ref 0.0–0.1)
Immature Granulocytes: 0 %
Lymphocytes Absolute: 2.2 10*3/uL (ref 0.7–3.1)
Lymphs: 31 %
MCH: 30.6 pg (ref 26.6–33.0)
MCHC: 34.2 g/dL (ref 31.5–35.7)
MCV: 90 fL (ref 79–97)
Monocytes Absolute: 0.4 10*3/uL (ref 0.1–0.9)
Monocytes: 6 %
Neutrophils Absolute: 4.2 10*3/uL (ref 1.4–7.0)
Neutrophils: 60 %
Platelets: 299 10*3/uL (ref 150–450)
RBC: 4.51 x10E6/uL (ref 3.77–5.28)
RDW: 12.4 % (ref 11.7–15.4)
WBC: 7 10*3/uL (ref 3.4–10.8)

## 2022-10-23 LAB — TSH: TSH: 2.03 u[IU]/mL (ref 0.450–4.500)

## 2022-10-23 LAB — COMPREHENSIVE METABOLIC PANEL
ALT: 28 IU/L (ref 0–32)
AST: 26 IU/L (ref 0–40)
Albumin/Globulin Ratio: 2.1 (ref 1.2–2.2)
Albumin: 4.7 g/dL (ref 3.9–4.9)
Alkaline Phosphatase: 62 IU/L (ref 44–121)
BUN/Creatinine Ratio: 16 (ref 9–23)
BUN: 15 mg/dL (ref 6–20)
Bilirubin Total: 0.4 mg/dL (ref 0.0–1.2)
CO2: 21 mmol/L (ref 20–29)
Calcium: 9.6 mg/dL (ref 8.7–10.2)
Chloride: 104 mmol/L (ref 96–106)
Creatinine, Ser: 0.94 mg/dL (ref 0.57–1.00)
Globulin, Total: 2.2 g/dL (ref 1.5–4.5)
Glucose: 96 mg/dL (ref 70–99)
Potassium: 3.8 mmol/L (ref 3.5–5.2)
Sodium: 137 mmol/L (ref 134–144)
Total Protein: 6.9 g/dL (ref 6.0–8.5)
eGFR: 79 mL/min/{1.73_m2} (ref >59)

## 2022-10-23 LAB — LIPID PANEL W/O CHOL/HDL RATIO
Cholesterol, Total: 215 mg/dL — ABNORMAL HIGH (ref 100–199)
HDL: 56 mg/dL (ref >39)
LDL Chol Calc (NIH): 138 mg/dL — ABNORMAL HIGH (ref 0–99)
Triglycerides: 120 mg/dL (ref 0–149)
VLDL Cholesterol Cal: 21 mg/dL (ref 5–40)

## 2022-10-23 LAB — VITAMIN D 25 HYDROXY (VIT D DEFICIENCY, FRACTURES): Vit D, 25-Hydroxy: 33.7 ng/mL (ref 30.0–100.0)

## 2022-10-23 NOTE — Progress Notes (Signed)
Contacted via MyChart   Good morning Makaleigh, labs have returned and overall are stable with exception of cholesterol levels remaining mildly elevated.  Your cholesterol is still high, but continued recommendations to make lifestyle changes. Your LDL is above normal. The LDL is the bad cholesterol. Over time and in combination with inflammation and other factors, this contributes to plaque which in turn may lead to stroke and/or heart attack down the road. Sometimes high LDL is primarily genetic, and people might be eating all the right foods but still have high numbers. Other times, there is room for improvement in one's diet and eating healthier can bring this number down and potentially reduce one's risk of heart attack and/or stroke.   To reduce your LDL, Remember - more fruits and vegetables, more fish, and limit red meat and dairy products. More soy, nuts, beans, barley, lentils, oats and plant sterol ester enriched margarine instead of butter. I also encourage eliminating sugar and processed food. Remember, shop on the outside of the grocery store and visit your [employer]. If you would like to talk with me about dietary changes for your cholesterol, please let me know. We should recheck your cholesterol in 12 months.  Any questions? Keep being stellar!!  Thank you for allowing me to participate in your care.  I appreciate you. Kindest regards, Cia Garretson

## 2022-11-29 ENCOUNTER — Encounter: Payer: Self-pay | Admitting: Nurse Practitioner

## 2023-02-08 ENCOUNTER — Ambulatory Visit: Payer: BC Managed Care – PPO | Admitting: Obstetrics and Gynecology

## 2023-02-18 ENCOUNTER — Ambulatory Visit (INDEPENDENT_AMBULATORY_CARE_PROVIDER_SITE_OTHER): Payer: BC Managed Care – PPO | Admitting: Obstetrics and Gynecology

## 2023-02-18 ENCOUNTER — Encounter: Payer: Self-pay | Admitting: Obstetrics and Gynecology

## 2023-02-18 VITALS — BP 108/75 | HR 83 | Ht 62.0 in | Wt 156.2 lb

## 2023-02-18 DIAGNOSIS — R350 Frequency of micturition: Secondary | ICD-10-CM | POA: Diagnosis not present

## 2023-02-18 DIAGNOSIS — N393 Stress incontinence (female) (male): Secondary | ICD-10-CM

## 2023-02-18 DIAGNOSIS — N8110 Cystocele, unspecified: Secondary | ICD-10-CM

## 2023-02-18 DIAGNOSIS — M62838 Other muscle spasm: Secondary | ICD-10-CM

## 2023-02-18 DIAGNOSIS — N816 Rectocele: Secondary | ICD-10-CM

## 2023-02-18 DIAGNOSIS — N3281 Overactive bladder: Secondary | ICD-10-CM

## 2023-02-18 LAB — POCT URINALYSIS DIPSTICK
Bilirubin, UA: NEGATIVE
Glucose, UA: NEGATIVE
Ketones, UA: NEGATIVE
Leukocytes, UA: NEGATIVE
Nitrite, UA: NEGATIVE
Protein, UA: NEGATIVE
Spec Grav, UA: >=1.03 — AB (ref 1.010–1.025)
Urobilinogen, UA: 0.2 E.U./dL
pH, UA: 6.5 (ref 5.0–8.0)
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
## 2023-03-21 NOTE — Progress Notes (Unsigned)
[facility]   Subjective:     Chief Complaint: Pessary Fitting (Karen Mosley is a 42 y.o. female is here for pessary fitting.)  History of Present Illness: Karen Mosley is a 42 y.o. female with stage II pelvic organ prolapse and stress incontinence who presents today for a pessary fitting.    Past Medical History: Patient  has a past medical history of Hypertension, Pessary maintenance, and Preeclampsia.   Past Surgical History: She  has a past surgical history that includes Fracture surgery (Right) and Wisdom tooth extraction.   Medications: She has a current medication list which includes the following prescription(s): losartan-hydrochlorothiazide.   Allergies: Patient is allergic to antihistamines, chlorpheniramine-type; lobster [shellfish allergy]; penicillins; wellbutrin [bupropion]; benadryl allergy [diphenhydramine hcl]; and red dye.   Social History: Patient  reports that she has quit smoking. Her smoking use included cigarettes. She has a 0.25 pack-year smoking history. She has never used smokeless tobacco. She reports current alcohol use. She reports that she does not use drugs.      Objective:    BP 116/74   Pulse 80  Gen: No apparent distress, A&O x 3. Pelvic Exam: Normal external female genitalia; Bartholin's and Skene's glands normal in appearance; urethral meatus normal in appearance, no urethral masses or discharge.     A size #4 incontinence dish pessary (Lot [id number]) was fitted. It was comfortable, stayed in place with valsalva and was an appropriate size on examination, with one finger fitting between the pessary and the vaginal walls. The patient demonstrated proper removal and replacement. She was able to urinate without difficulty.    Assessment/Plan:    Assessment: Karen Mosley is a 42 y.o. with stage II pelvic organ prolapse and stress incontinence who presents for a pessary fitting. Plan: She was fitted with a #4 incontinence  dish pessary. She will remove weekly or more as needed . She will use lubricant.   Follow-up in 3 months for a pessary check and a simple CMG or sooner as needed.  All questions were answered.    Selmer Dominion, NP

## 2023-03-22 ENCOUNTER — Encounter: Payer: Self-pay | Admitting: Obstetrics and Gynecology

## 2023-03-22 ENCOUNTER — Ambulatory Visit: Payer: BC Managed Care – PPO | Admitting: Obstetrics and Gynecology

## 2023-03-22 VITALS — BP 116/74 | HR 80

## 2023-03-22 DIAGNOSIS — N816 Rectocele: Secondary | ICD-10-CM

## 2023-03-22 DIAGNOSIS — N393 Stress incontinence (female) (male): Secondary | ICD-10-CM

## 2023-03-22 DIAGNOSIS — N8110 Cystocele, unspecified: Secondary | ICD-10-CM

## 2023-03-22 NOTE — Addendum Note (Signed)
Addended by: Selmer Dominion on: 03/22/2023 04:21 PM   Modules accepted: Level of Service

## 2023-03-22 NOTE — Patient Instructions (Signed)
Come back in 3 months for simple cmg

## 2023-04-07 ENCOUNTER — Encounter: Payer: Self-pay | Admitting: Obstetrics and Gynecology

## 2023-06-23 ENCOUNTER — Ambulatory Visit: Payer: BC Managed Care – PPO | Admitting: Obstetrics and Gynecology

## 2023-06-23 ENCOUNTER — Encounter: Payer: Self-pay | Admitting: Obstetrics and Gynecology

## 2023-06-23 VITALS — BP 112/77 | HR 79

## 2023-06-23 DIAGNOSIS — N393 Stress incontinence (female) (male): Secondary | ICD-10-CM

## 2023-06-23 DIAGNOSIS — N816 Rectocele: Secondary | ICD-10-CM

## 2023-06-23 DIAGNOSIS — N8110 Cystocele, unspecified: Secondary | ICD-10-CM

## 2023-06-23 NOTE — Progress Notes (Signed)
Verbal consent was obtained to perform simple CMG procedure:   Prolapse was reduced using 2 large cotton swabs. Urethra was prepped with betadine and a 31F catheter was placed and bladder was drained completely. The bladder was then backfilled with sterile water by gravity.  First sensation: 85 First Desire: 140 Strong Desire: 220 Capacity: 320 Cough stress test was positive. Valsalva stress test was positive.  She was was allowed to void on her own.   Interpretation: CMG showed within normal limits sensation, and within normal limits cystometric capacity. Findings positive for stress incontinence, negative for detrusor overactivity.    Patient is planning on anterior and posterior repair with a Mid-urethral sling. Will send message to surgery scheduler for this. Patient does not need clearance.

## 2023-07-05 ENCOUNTER — Encounter: Payer: Self-pay | Admitting: Obstetrics and Gynecology

## 2023-08-25 ENCOUNTER — Telehealth (INDEPENDENT_AMBULATORY_CARE_PROVIDER_SITE_OTHER): Payer: BC Managed Care – PPO | Admitting: Obstetrics and Gynecology

## 2023-08-25 DIAGNOSIS — Z01818 Encounter for other preprocedural examination: Secondary | ICD-10-CM

## 2023-08-25 MED ORDER — OXYCODONE HCL 5 MG PO TABS
5.0000 mg | ORAL_TABLET | ORAL | 0 refills | Status: AC | PRN
Start: 2023-08-25 — End: ?

## 2023-08-25 MED ORDER — ACETAMINOPHEN 500 MG PO TABS
500.0000 mg | ORAL_TABLET | Freq: Four times a day (QID) | ORAL | 0 refills | Status: AC | PRN
Start: 2023-08-25 — End: ?

## 2023-08-25 MED ORDER — POLYETHYLENE GLYCOL 3350 17 GM/SCOOP PO POWD
17.0000 g | Freq: Every day | ORAL | 0 refills | Status: AC
Start: 2023-08-25 — End: ?

## 2023-08-25 MED ORDER — IBUPROFEN 600 MG PO TABS
600.0000 mg | ORAL_TABLET | Freq: Four times a day (QID) | ORAL | 0 refills | Status: AC | PRN
Start: 2023-08-25 — End: ?

## 2023-08-25 NOTE — Progress Notes (Signed)
[name]- video visit  Patient location- [place] Provider location- [place] Patient consented to video visit and was the only person on the call  Subjective Chief Complaint: Karen Mosley presents for a preoperative encounter.   History of Present Illness: Karen Mosley is a 42 y.o. female who presents for preoperative visit.  She is scheduled to undergo Exam under anesthesia, Anterior and posterior repair, midurethral sling, cystoscopy on 09/05/23.  Her symptoms include vaginal bulge and incontinence, and she was was found to have Stage II anterior, Stage II posterior, Stage 0 apical prolapse.   Had positive cough stress test on exam.   Past Medical History:  Diagnosis Date   Hypertension    Pessary maintenance    Preeclampsia      Past Surgical History:  Procedure Laterality Date   FRACTURE SURGERY Right    arm   WISDOM TOOTH EXTRACTION      is allergic to antihistamines, chlorpheniramine-type; lobster [shellfish allergy]; penicillins; wellbutrin [bupropion]; benadryl allergy [diphenhydramine hcl]; and red dye #40 (allura red).   Family History  Problem Relation Age of Onset   Hypertension Mother    Hypertension Father    Heart attack Father    Asthma Son    ADD / ADHD Son    Hypertension Maternal Aunt    Kidney disease Maternal Grandmother    Stroke Maternal Grandmother    Breast cancer Maternal Grandmother        with recurrence   Hypertension Maternal Grandmother    Pulmonary embolism Maternal Grandfather    Congestive Heart Failure Maternal Grandfather    Pulmonary embolism Paternal Grandfather    Hypertension Paternal Grandfather     Social History   Tobacco Use   Smoking status: Former    Current packs/day: 0.25    Average packs/day: 0.3 packs/day for 1 year (0.3 ttl pk-yrs)    Types: Cigarettes   Smokeless tobacco: Never  Vaping Use   Vaping status: Never Used  Substance Use Topics   Alcohol use: Yes    Comment: occass   Drug use:  Never     Review of Systems was negative for a full 10 system review except as noted in the History of Present Illness.   Current Outpatient Medications:    losartan-hydrochlorothiazide (HYZAAR) 50-12.5 MG tablet, TAKE 1 TABLET BY MOUTH EVERY DAY, Disp: 90 tablet, Rfl: 3   Objective There were no vitals filed for this visit.  Previous Pelvic Exam showed: POP-Q (02/18/23)   -1                                            Aa   -1                                           Ba   -8.5                                              C    3  Gh   5                                            Pb   10                                            tvl    -1                                            Ap   -1                                            Bp   -8                                              D       Assessment/ Plan  Assessment: The patient is a 42 y.o. year old scheduled to undergo Exam under anesthesia, Anterior and posterior repair, midurethral sling, cystoscopy.   Plan: General Surgical Consent: The patient has previously been counseled on alternative treatments, and the decision by the patient and provider was to proceed with the procedure listed above.  For all procedures, there are risks of bleeding, infection, damage to surrounding organs including but not limited to bowel, bladder, blood vessels, ureters and nerves, and need for further surgery if an injury were to occur. These risks are all low with minimally invasive surgery.   There are risks of numbness and weakness at any body site or buttock/rectal pain.  It is possible that baseline pain can be worsened by surgery, either with or without mesh. If surgery is vaginal, there is also a low risk of possible conversion to laparoscopy or open abdominal incision where indicated. Very rare risks include blood transfusion, blood clot, heart attack, pneumonia, or death.   There  is also a risk of short-term postoperative urinary retention with need to use a catheter. About half of patients need to go home from surgery with a catheter, which is then later removed in the office. The risk of long-term need for a catheter is very low. There is also a risk of worsening of overactive bladder.   Sling: The effectiveness of a midurethral vaginal mesh sling is approximately 85%, and thus, there will be times when you may leak urine after surgery, especially if your bladder is full or if you have a strong cough. There is a balance between making the sling tight enough to treat your leakage but not too tight so that you have long-term difficulty emptying your bladder. A mesh sling will not directly treat overactive bladder/urge incontinence and may worsen it.  There is an FDA safety notification on vaginal mesh procedures for prolapse but NOT mesh slings. We have extensive experience and training with mesh placement and we have close postoperative follow up to identify any potential complications from mesh. It is important to  realize that this mesh is a permanent implant that cannot be easily removed. There are rare risks of mesh exposure (2-4%), pain with intercourse (0-7%), and infection (<1%). The risk of mesh exposure if more likely in a woman with risks for poor healing (prior radiation, poorly controlled diabetes, or immunocompromised). The risk of new or worsened chronic pain after mesh implant is more common in women with baseline chronic pain and/or poorly controlled anxiety or depression. Approximately 2-4% of patients will experience longer-term post-operative voiding dysfunction that may require surgical revision of the sling. We also reviewed that postoperatively, her stream may not be as strong as before surgery.    Prolapse (with or without mesh): Risk factors for surgical failure  include things that put pressure on your pelvis and the surgical repair, including obesity, chronic  cough, and heavy lifting or straining (including lifting children or adults, straining on the toilet, or lifting heavy objects such as furniture or anything weighing >25 lbs. Risks of recurrence is 20-30% with vaginal native tissue repair and a less than 10% with sacrocolpopexy with mesh.     Risks for blood transfusion include allergic reactions, other reactions that can affect different body organs and managed accordingly, transmission of infectious diseases such as HIV or Hepatitis. However, the blood is screened. Patient consents for blood products.  Pre-operative instructions:  She was instructed to not take Aspirin/NSAIDs x 7days prior to surgery.  Antibiotic prophylaxis was ordered as indicated.  Catheter use: Patient will go home with foley if needed after post-operative voiding trial.  Post-operative instructions:  She was provided with specific post-operative instructions, including precautions and signs/symptoms for which we would recommend contacting us, in addition to daytime and after-hours contact phone numbers. This was provided on a handout.   Post-operative medications: Prescriptions for motrin, tylenol, miralax, and oxycodone were sent to her pharmacy. Discussed using ibuprofen and tylenol on a schedule to limit use of narcotics.   Laboratory testing:  none  Preoperative clearance:  She does not require surgical clearance.    Post-operative follow-up:  A post-operative appointment will be made for 6 weeks from the date of surgery. If she needs a post-operative nurse visit for a voiding trial, that will be set up after she leaves the hospital.    Patient will call the clinic or use MyChart should anything change or any new issues arise.   Marguerita Beards, MD   Time spent: I spent 20 minutes dedicated to the care of this patient on the date of this encounter to include pre-visit review of records, face-to-face time with the patient discussing surgery and post visit  documentation and ordering medication/ testing.

## 2023-08-25 NOTE — Patient Instructions (Addendum)
Plan for surgery: Anterior and posterior repair, midurethral sling, cystoscopy  Pre op instructions-  - No NSAIDS (ibuprofen, aleve, motrin, aspirin) for 7 days prior to surgery - Arrive to the surgery center 2 hours prior to your surgery time - No eating or drinking after midnight on the day of surgery unless otherwise instructed by the pre op nurse.    POST OPERATIVE INSTRUCTIONS  General Instructions Recovery (not bed rest) will last approximately 6 weeks Walking is encouraged, but refrain from strenuous exercise/ housework/ heavy lifting. No lifting >10lbs  Nothing in the vagina- NO intercourse, tampons or douching Bathing:  Do not submerge in water (NO swimming, bath, hot tub, etc) until after your postop visit. You can shower starting the day after surgery.  No driving until you are not taking narcotic pain medicine and until your pain is well enough controlled that you can slam on the breaks or make sudden movements if needed.   Taking your medications Please take your acetaminophen and ibuprofen on a schedule for the first 48 hours. Take 600mg  ibuprofen, then take 500mg  acetaminophen 3 hours later, then continue to alternate ibuprofen and acetaminophen. That way you are taking each type of medication every 6 hours. Take the prescribed narcotic (oxycodone, tramadol, etc) as needed, with a maximum being every 4 hours.  Take a stool softener daily to keep your stools soft and preventing you from straining. If you have diarrhea, you decrease your stool softener. This is explained more below. We have prescribed you Miralax.  Reasons to Call the Nurse (see last page for phone numbers) Heavy Bleeding (changing your pad every 1-2 hours) Persistent nausea/vomiting Fever (100.4 degrees or more) Incision problems (pus or other fluid coming out, redness, warmth, increased pain)  Things to Expect After Surgery Mild to Moderate pain is normal during the first day or two after surgery. If  prescribed, take Ibuprofen or Tylenol first and use the stronger medicine for "break-through" pain. You can overlap these medicines because they work differently.   Constipation   To Prevent Constipation:  Eat a well-balanced diet including protein, grains, fresh fruit and vegetables.  Drink plenty of fluids. Walk regularly.  Depending on specific instructions from your physician: take Miralax daily and additionally you can add a stool softener (colace/ docusate) and fiber supplement. Continue as long as you're on pain medications.   To Treat Constipation:  If you do not have a bowel movement in 2 days after surgery, you can take 2 Tbs of Milk of Magnesia 1-2 times a day until you have a bowel movement. If diarrhea occurs, decrease the amount or stop the laxative. If no results with Milk of Magnesia, you can drink a bottle of magnesium citrate which you can purchase over the counter.  Fatigue:  This is a normal response to surgery and will improve with time.  Plan frequent rest periods throughout the day.  Gas Pain:  This is very common but can also be very painful! Drink warm liquids such as herbal teas, bouillon or soup. Walking will help you pass more gas.  Mylicon or Gas-X can be taken over the counter.  Leaking Urine:  Varying amounts of leakage may occur after surgery.  This should improve with time. Your bladder needs at least 3 months to recover from surgery. If you leak after surgery, be sure to mention this to your doctor at your post-op visit. If you were taking medications for overactive bladder prior to surgery, be sure to restart the medications  immediately after surgery.  Incisions: If you have incisions on your abdomen, the skin glue will dissolve on its own over time. It is ok to gently rinse with soap and water over these incisions but do not scrub.  Catheter Approximately 50% of patients are unable to urinate after surgery and need to go home with a catheter. This allows your  bladder to rest so it can return to full function. If you go home with a catheter, the office will call to set up a voiding trial a few days after surgery. For most patients, by this visit, they are able to urinate on their own. Long term catheter use is rare.   Return to Work  As work demands and recovery times vary widely, it is hard to predict when you will want to return to work. If you have a desk job with no strenuous physical activity, and if you would like to return sooner than generally recommended, discuss this with your provider or call our office.   Post op concerns  For non-emergent issues, please call the Urogynecology Nurse. Please leave a message and someone will contact you within one business day.  You can also send a message through MyChart.   AFTER HOURS (After 5:00 PM and on weekends):  For urgent matters that cannot wait until the next business day. Call our office [phone] and connect to the doctor on call.  Please reserve this for important issues.   **FOR ANY TRUE EMERGENCY ISSUES CALL 911 OR GO TO THE NEAREST EMERGENCY ROOM.** Please inform our office or the doctor on call of any emergency.     APPOINTMENTS: Call [phone]

## 2023-08-25 NOTE — H&P (Signed)
Pre Operative H&P  Subjective Chief Complaint: Lawrence Santiago Riding presents for a preoperative encounter.   History of Present Illness: Karen Mosley is a 42 y.o. female who presents for preoperative visit.  She is scheduled to undergo Exam under anesthesia, Anterior and posterior repair, midurethral sling, cystoscopy on 09/05/23.  Her symptoms include vaginal bulge and incontinence, and she was was found to have Stage II anterior, Stage II posterior, Stage 0 apical prolapse.   Had positive cough stress test on exam.   Past Medical History:  Diagnosis Date   Hypertension    Pessary maintenance    Preeclampsia      Past Surgical History:  Procedure Laterality Date   FRACTURE SURGERY Right    arm   WISDOM TOOTH EXTRACTION      is allergic to antihistamines, chlorpheniramine-type; lobster [shellfish allergy]; penicillins; wellbutrin [bupropion]; benadryl allergy [diphenhydramine hcl]; and red dye #40 (allura red).   Family History  Problem Relation Age of Onset   Hypertension Mother    Hypertension Father    Heart attack Father    Asthma Son    ADD / ADHD Son    Hypertension Maternal Aunt    Kidney disease Maternal Grandmother    Stroke Maternal Grandmother    Breast cancer Maternal Grandmother        with recurrence   Hypertension Maternal Grandmother    Pulmonary embolism Maternal Grandfather    Congestive Heart Failure Maternal Grandfather    Pulmonary embolism Paternal Grandfather    Hypertension Paternal Grandfather     Social History   Tobacco Use   Smoking status: Former    Current packs/day: 0.25    Average packs/day: 0.3 packs/day for 1 year (0.3 ttl pk-yrs)    Types: Cigarettes   Smokeless tobacco: Never  Vaping Use   Vaping status: Never Used  Substance Use Topics   Alcohol use: Yes    Comment: occass   Drug use: Never     Review of Systems was negative for a full 10 system review except as noted in the History of Present Illness.  No current  facility-administered medications for this encounter.  Current Outpatient Medications:    acetaminophen (TYLENOL) 500 MG tablet, Take 1 tablet (500 mg total) by mouth every 6 (six) hours as needed (pain)., Disp: 30 tablet, Rfl: 0   ibuprofen (ADVIL) 600 MG tablet, Take 1 tablet (600 mg total) by mouth every 6 (six) hours as needed., Disp: 30 tablet, Rfl: 0   losartan-hydrochlorothiazide (HYZAAR) 50-12.5 MG tablet, TAKE 1 TABLET BY MOUTH EVERY DAY, Disp: 90 tablet, Rfl: 3   oxyCODONE (OXY IR/ROXICODONE) 5 MG immediate release tablet, Take 1 tablet (5 mg total) by mouth every 4 (four) hours as needed for severe pain., Disp: 5 tablet, Rfl: 0   polyethylene glycol powder (GLYCOLAX/MIRALAX) 17 GM/SCOOP powder, Take 17 g by mouth daily. Drink 17g (1 scoop) dissolved in water per day., Disp: 255 g, Rfl: 0   Objective There were no vitals filed for this visit.  Previous Pelvic Exam showed: POP-Q (02/18/23)   -1                                            Aa   -1  Ba   -8.5                                              C    3                                            Gh   5                                            Pb   10                                            tvl    -1                                            Ap   -1                                            Bp   -8                                              D       Assessment/ Plan   Exam under anesthesia, Anterior and posterior repair, midurethral sling, cystoscopy.   Marguerita Beards, MD

## 2023-08-26 ENCOUNTER — Encounter (HOSPITAL_BASED_OUTPATIENT_CLINIC_OR_DEPARTMENT_OTHER): Payer: Self-pay | Admitting: Obstetrics and Gynecology

## 2023-08-26 ENCOUNTER — Encounter: Payer: BC Managed Care – PPO | Admitting: Obstetrics and Gynecology

## 2023-08-30 ENCOUNTER — Encounter (HOSPITAL_BASED_OUTPATIENT_CLINIC_OR_DEPARTMENT_OTHER): Payer: Self-pay | Admitting: Obstetrics and Gynecology

## 2023-08-30 NOTE — Progress Notes (Signed)
Spoke w/ via phone for pre-op interview--- pt Lab needs dos----  istat, EKG, urine preg       Lab results------ no COVID test -----patient states asymptomatic no test needed Arrive at ------- 1100 on 09-05-2023 NPO after MN NO Solid Food.  Clear liquids from MN until--- 1000 Med rec completed Medications to take morning of surgery ----- none Diabetic medication ----- n/a Patient instructed no nail polish to be worn day of surgery Patient instructed to bring photo id and insurance card day of surgery Patient aware to have Driver (ride ) / caregiver    for 24 hours after surgery - mother, Karen Mosley Patient Special Instructions ----- n/a Pre-Op special Instructions ----- n/a Patient verbalized understanding of instructions that were given at this phone interview. Patient denies chest pain, sob, fever, cough at the interview.

## 2023-09-05 ENCOUNTER — Encounter (HOSPITAL_BASED_OUTPATIENT_CLINIC_OR_DEPARTMENT_OTHER): Payer: Self-pay | Admitting: Obstetrics and Gynecology

## 2023-09-05 ENCOUNTER — Ambulatory Visit (HOSPITAL_BASED_OUTPATIENT_CLINIC_OR_DEPARTMENT_OTHER): Payer: BC Managed Care – PPO | Admitting: Anesthesiology

## 2023-09-05 ENCOUNTER — Encounter (HOSPITAL_BASED_OUTPATIENT_CLINIC_OR_DEPARTMENT_OTHER)
Admission: RE | Disposition: A | Discharge status: Home / Self Care | Payer: Self-pay | Source: Home / Self Care | Attending: Obstetrics and Gynecology

## 2023-09-05 ENCOUNTER — Ambulatory Visit (HOSPITAL_BASED_OUTPATIENT_CLINIC_OR_DEPARTMENT_OTHER)
Admission: RE | Admit: 2023-09-05 | Discharge: 2023-09-05 | Disposition: A | Discharge status: Home / Self Care | Payer: BC Managed Care – PPO | Attending: Obstetrics and Gynecology | Admitting: Obstetrics and Gynecology

## 2023-09-05 ENCOUNTER — Other Ambulatory Visit: Payer: Self-pay

## 2023-09-05 DIAGNOSIS — Z01818 Encounter for other preprocedural examination: Secondary | ICD-10-CM

## 2023-09-05 DIAGNOSIS — N393 Stress incontinence (female) (male): Secondary | ICD-10-CM

## 2023-09-05 DIAGNOSIS — Z87891 Personal history of nicotine dependence: Secondary | ICD-10-CM | POA: Diagnosis not present

## 2023-09-05 DIAGNOSIS — N8110 Cystocele, unspecified: Secondary | ICD-10-CM

## 2023-09-05 DIAGNOSIS — I10 Essential (primary) hypertension: Secondary | ICD-10-CM | POA: Insufficient documentation

## 2023-09-05 DIAGNOSIS — N816 Rectocele: Secondary | ICD-10-CM

## 2023-09-05 DIAGNOSIS — N8112 Cystocele, lateral: Secondary | ICD-10-CM | POA: Insufficient documentation

## 2023-09-05 HISTORY — DX: Stress incontinence (female) (male): N39.3

## 2023-09-05 HISTORY — PX: ANTERIOR AND POSTERIOR REPAIR: SHX5121

## 2023-09-05 HISTORY — PX: BLADDER SUSPENSION: SHX72

## 2023-09-05 HISTORY — DX: Cystocele, unspecified: N81.10

## 2023-09-05 HISTORY — PX: CYSTOSCOPY: SHX5120

## 2023-09-05 LAB — POCT I-STAT, CHEM 8
BUN: 17 mg/dL (ref 6–20)
Calcium, Ion: 1.3 mmol/L (ref 1.15–1.40)
Chloride: 105 mmol/L (ref 98–111)
Creatinine, Ser: 0.8 mg/dL (ref 0.44–1.00)
Glucose, Bld: 85 mg/dL (ref 70–99)
HCT: 40 % (ref 36.0–46.0)
Hemoglobin: 13.6 g/dL (ref 12.0–15.0)
Potassium: 3.9 mmol/L (ref 3.5–5.1)
Sodium: 139 mmol/L (ref 135–145)
TCO2: 21 mmol/L — ABNORMAL LOW (ref 22–32)

## 2023-09-05 LAB — POCT PREGNANCY, URINE: Preg Test, Ur: NEGATIVE

## 2023-09-05 SURGERY — ANTERIOR (CYSTOCELE) AND POSTERIOR REPAIR (RECTOCELE)
Anesthesia: General | Site: Vagina

## 2023-09-05 MED ORDER — KETOROLAC TROMETHAMINE 30 MG/ML IJ SOLN
INTRAMUSCULAR | Status: DC | PRN
  Administered 2023-09-05: 30 mg via INTRAVENOUS

## 2023-09-05 MED ORDER — LIDOCAINE-EPINEPHRINE 1 %-1:100000 IJ SOLN
INTRAMUSCULAR | Status: DC | PRN
  Administered 2023-09-05: 16 mL

## 2023-09-05 MED ORDER — SODIUM CHLORIDE 0.9 % IR SOLN
Status: DC | PRN
  Administered 2023-09-05: 1000 mL

## 2023-09-05 MED ORDER — ACETAMINOPHEN 10 MG/ML IV SOLN: 1000.0000 mg | Freq: Once | INTRAVENOUS | Status: DC | PRN

## 2023-09-05 MED ORDER — SURGIFLO WITH THROMBIN (HEMOSTATIC MATRIX KIT) OPTIME
TOPICAL | Status: DC | PRN
Start: 2023-09-05 — End: 2023-09-05
  Administered 2023-09-05: 1 via TOPICAL

## 2023-09-05 MED ORDER — FENTANYL CITRATE (PF) 100 MCG/2ML IJ SOLN
INTRAMUSCULAR | Status: AC
  Filled 2023-09-05: qty 2

## 2023-09-05 MED ORDER — PROPOFOL 10 MG/ML IV BOLUS
INTRAVENOUS | Status: DC | PRN
Start: 2023-09-05 — End: 2023-09-05
  Administered 2023-09-05: 200 mg via INTRAVENOUS
  Administered 2023-09-05 (×2): 50 mg via INTRAVENOUS

## 2023-09-05 MED ORDER — PROMETHAZINE HCL 25 MG/ML IJ SOLN: 6.2500 mg | INTRAMUSCULAR | Status: DC | PRN

## 2023-09-05 MED ORDER — SCOPOLAMINE 1 MG/3DAYS TD PT72
MEDICATED_PATCH | TRANSDERMAL | Status: AC
  Filled 2023-09-05: qty 1

## 2023-09-05 MED ORDER — OXYCODONE HCL 5 MG/5ML PO SOLN: 5.0000 mg | Freq: Once | ORAL | Status: AC | PRN

## 2023-09-05 MED ORDER — PHENAZOPYRIDINE HCL 100 MG PO TABS: 200.0000 mg | ORAL_TABLET | ORAL | Status: DC

## 2023-09-05 MED ORDER — OXYCODONE HCL 5 MG PO TABS
ORAL_TABLET | ORAL | Status: AC
  Filled 2023-09-05: qty 1

## 2023-09-05 MED ORDER — PROPOFOL 10 MG/ML IV BOLUS
INTRAVENOUS | Status: AC
  Filled 2023-09-05: qty 20

## 2023-09-05 MED ORDER — LIDOCAINE 2% (20 MG/ML) 5 ML SYRINGE
INTRAMUSCULAR | Status: DC | PRN
  Administered 2023-09-05: 50 mg via INTRAVENOUS

## 2023-09-05 MED ORDER — FENTANYL CITRATE (PF) 100 MCG/2ML IJ SOLN
INTRAMUSCULAR | Status: DC | PRN
  Administered 2023-09-05: 50 ug via INTRAVENOUS
  Administered 2023-09-05 (×2): 25 ug via INTRAVENOUS

## 2023-09-05 MED ORDER — DEXAMETHASONE SODIUM PHOSPHATE 10 MG/ML IJ SOLN
INTRAMUSCULAR | Status: DC | PRN
  Administered 2023-09-05: 4 mg via INTRAVENOUS

## 2023-09-05 MED ORDER — PHENAZOPYRIDINE HCL 100 MG PO TABS
ORAL_TABLET | ORAL | Status: AC
  Filled 2023-09-05: qty 2

## 2023-09-05 MED ORDER — FENTANYL CITRATE (PF) 100 MCG/2ML IJ SOLN: 25.0000 ug | INTRAMUSCULAR | Status: DC | PRN

## 2023-09-05 MED ORDER — ONDANSETRON HCL 4 MG/2ML IJ SOLN
INTRAMUSCULAR | Status: DC | PRN
  Administered 2023-09-05: 4 mg via INTRAVENOUS

## 2023-09-05 MED ORDER — SCOPOLAMINE 1 MG/3DAYS TD PT72
1.0000 | MEDICATED_PATCH | TRANSDERMAL | Status: DC
  Administered 2023-09-05: 1.5 mg via TRANSDERMAL

## 2023-09-05 MED ORDER — CEFAZOLIN SODIUM-DEXTROSE 2-4 GM/100ML-% IV SOLN
INTRAVENOUS | Status: AC
  Filled 2023-09-05: qty 100

## 2023-09-05 MED ORDER — KETOROLAC TROMETHAMINE 30 MG/ML IJ SOLN: 30.0000 mg | Freq: Once | INTRAMUSCULAR | Status: DC | PRN

## 2023-09-05 MED ORDER — CEFAZOLIN SODIUM-DEXTROSE 2-4 GM/100ML-% IV SOLN
2.0000 g | INTRAVENOUS | Status: AC
  Administered 2023-09-05: 2 g via INTRAVENOUS

## 2023-09-05 MED ORDER — AMISULPRIDE (ANTIEMETIC) 5 MG/2ML IV SOLN: 10.0000 mg | Freq: Once | INTRAVENOUS | Status: DC | PRN

## 2023-09-05 MED ORDER — MIDAZOLAM HCL 2 MG/2ML IJ SOLN
INTRAMUSCULAR | Status: AC
  Filled 2023-09-05: qty 2

## 2023-09-05 MED ORDER — MIDAZOLAM HCL 5 MG/5ML IJ SOLN
INTRAMUSCULAR | Status: DC | PRN
  Administered 2023-09-05: 2 mg via INTRAVENOUS

## 2023-09-05 MED ORDER — 0.9 % SODIUM CHLORIDE (POUR BTL) OPTIME
TOPICAL | Status: DC | PRN
  Administered 2023-09-05: 500 mL

## 2023-09-05 MED ORDER — POVIDONE-IODINE 10 % EX SWAB: 2.0000 | Freq: Once | CUTANEOUS | Status: DC

## 2023-09-05 MED ORDER — LACTATED RINGERS IV SOLN: INTRAVENOUS | Status: DC

## 2023-09-05 MED ORDER — LIDOCAINE HCL (PF) 2 % IJ SOLN
INTRAMUSCULAR | Status: AC
  Filled 2023-09-05: qty 5

## 2023-09-05 MED ORDER — OXYCODONE HCL 5 MG PO TABS
5.0000 mg | ORAL_TABLET | Freq: Once | ORAL | Status: AC | PRN
  Administered 2023-09-05: 5 mg via ORAL

## 2023-09-05 SURGICAL SUPPLY — 34 items
ADH SKN CLS APL DERMABOND .7 (GAUZE/BANDAGES/DRESSINGS) ×3
AGENT HMST KT MTR STRL THRMB (HEMOSTASIS) ×3
BLADE SURG 15 STRL LF DISP TIS (BLADE) ×3 IMPLANT
BLADE SURG 15 STRL SS (BLADE) ×3
DERMABOND ADVANCED .7 DNX12 (GAUZE/BANDAGES/DRESSINGS) ×3 IMPLANT
GLOVE BIOGEL PI IND STRL 6.5 (GLOVE) ×3 IMPLANT
GLOVE BIOGEL PI IND STRL 7.0 (GLOVE) ×3 IMPLANT
GLOVE ECLIPSE 6.0 STRL STRAW (GLOVE) ×3 IMPLANT
GLOVE SURG SS PI 7.0 STRL IVOR (GLOVE) IMPLANT
GOWN STRL REUS W/TWL LRG LVL3 (GOWN DISPOSABLE) ×3 IMPLANT
HOLDER FOLEY CATH W/STRAP (MISCELLANEOUS) ×3 IMPLANT
KIT TURNOVER CYSTO (KITS) ×3 IMPLANT
MANIFOLD NEPTUNE II (INSTRUMENTS) ×3 IMPLANT
NDL HYPO 22X1.5 SAFETY MO (MISCELLANEOUS) ×3 IMPLANT
NEEDLE HYPO 22X1.5 SAFETY MO (MISCELLANEOUS) ×3
NS IRRIG 500ML POUR BTL (IV SOLUTION) IMPLANT
PACK CYSTO (CUSTOM PROCEDURE TRAY) ×3 IMPLANT
PACK VAGINAL WOMENS (CUSTOM PROCEDURE TRAY) ×3 IMPLANT
PAD OB MATERNITY 4.3X12.25 (PERSONAL CARE ITEMS) ×3 IMPLANT
RETRACTOR LONE STAR DISPOSABLE (INSTRUMENTS) ×3 IMPLANT
RETRACTOR STAY HOOK 5MM (MISCELLANEOUS) ×3 IMPLANT
SCRUB CHG 4% DYNA-HEX 4OZ (MISCELLANEOUS) ×3 IMPLANT
SET IRRIG Y TYPE TUR BLADDER L (SET/KITS/TRAYS/PACK) ×3 IMPLANT
SLEEVE SCD COMPRESS KNEE MED (STOCKING) ×3 IMPLANT
SLING ADVANTAGE FIT TRANVAG (Sling) IMPLANT
SPIKE FLUID TRANSFER (MISCELLANEOUS) ×3 IMPLANT
SUCTION TUBE FRAZIER 10FR DISP (SUCTIONS) ×3 IMPLANT
SURGIFLO W/THROMBIN 8M KIT (HEMOSTASIS) IMPLANT
SUT VIC AB 2-0 SH 27 (SUTURE) ×3
SUT VIC AB 2-0 SH 27XBRD (SUTURE) ×3 IMPLANT
SUT VICRYL 2-0 SH 8X27 (SUTURE) ×3 IMPLANT
SYR BULB EAR ULCER 3OZ GRN STR (SYRINGE) ×3 IMPLANT
TOWEL OR 17X24 6PK STRL BLUE (TOWEL DISPOSABLE) ×3 IMPLANT
TRAY FOLEY W/BAG SLVR 14FR LF (SET/KITS/TRAYS/PACK) ×3 IMPLANT

## 2023-09-05 NOTE — Op Note (Signed)
Operative Note  Preoperative Diagnosis: anterior vaginal prolapse, posterior vaginal prolapse, and stress urinary incontinence  Postoperative Diagnosis: same  Procedures performed:  Anterior and posterior repair, midurethral sling (Advantage Fit), cystoscopy  Implants:  Implant Name Type Inv. Item Serial No. Manufacturer Lot No. LRB No. Used Action  [employer]ador [employer] [facility] - [id number] Sling SLING ADVANTAGE FIT Regina Eck [id number] N/A 1 Implanted    Attending Surgeon: Lanetta Inch, MD  Anesthesia: General LMA  Findings: 1. On vaginal exam, stage II prolapse noted  2. On cystoscopy, normal bladder and urethra without injury or lesion. Brisk bilateral ureteral efflux noted.   Specimens: none  Estimated blood loss: 75 mL  IV fluids: 100 mL  Urine output: 100 mL  Complications: none  Procedure in Detail:  After informed consent was obtained, the patient was taken to the operating room where general anesthesia was induced. She was placed in dorsal lithotomy position, taking care to avoid any traction of the extremities and prepped and draped in the usual sterile fashion. A self-retaining lonestar retractor was placed using four elastic blue stays.  After a foley catheter was inserted into the urethra, the location of the midurethra was palpated. Two Allis clamps were along the anterior vaginal wall defect. 1% lidocaine with epinephrine was injected into the vaginal mucosa.  A vertical incision was made between these two Allis clamps with a 15 blade scalpel.  Allis clamps were placed along this incision and Metzenbaum scissors were used to undermine the vaginal mucosa along the incision.  The vaginal mucosa was then sharply dissected off to the vesicovaginal septum bilaterally to the level of the pubic rami.  Anterior plication of the vesicovaginal septum was then performed using plicating sutures of 2-0 Vicryl. The vaginal mucosal edges were trimmed and the incision  reapproximated with 2-0 Vicryl in a running fashion.    The mid urethral area was located on the anterior vaginal wall.  Two Allis clamps were placed at the level of the midurethra. 1% lidocaine with epinephrine was injected into the vaginal mucosa. A vertical incision was made between the two clamps using a 15-blade scalpel.  Using sharp dissection, Metzenbaum scissors were used to make a periurethral tunnel from the vaginal incision towards the pubic rami bilaterally for the future sling tracts. The bladder was ensured to be empty. The trocar and attached sling were introduced into the right side of the periurethral vaginal incision, just inferior to the pubic symphysis on the right side. The trocar was guided through the endopelvic fascia and directly vertically.  While hugging the cephalad surface of the pubic bone, the trocar was guided out through the abdomen 2 fingerbreadths lateral to midline at the level of the pubic symphysis on the ipsilateral side. The trocar was placed on the left side in a similar fashion.  A 70-degree cystoscope was introduced, and 360-degree inspection revealed no trauma or trocars in the bladder, with brisk bilateral ureteral efflux.  The bladder was drained and the cystoscope was removed.  The Foley catheter was reinserted.  The sling was brought to lie beneath the mid-urethra.  A needle driver was placed behind the sling to ensure no tension.   The plastic sheath was removed from the sling and the distal ends of the sling were trimmed just below the level of the skin incisions.  Tension-free positioning of the sling was confirmed. Vaginal inspection revealed no vaginotomy or sling perforations of the mucosa. Surgiflo was placed into the incision and pressure was  held. Good hemostasis was noted.  The vaginal mucosal edges were reapproximated using 2-0 Vicryl.  The vagina was copiously irrigated.  Hemostasis was again noted. The suprapubic sling incisions were closed with  Dermabond.   Attention was then turned to the posterior vagina.  Two Allis clamps were placed in the midline of the posterior vaginal wall defect.  1% lidocaine with epinephrine was injected into the vaginal mucosa. A vertical incision was made between these clamps with a 15 blade scalpel.  The rectovaginal septum was then dissected off the vaginal mucosa bilaterally.  No enterocele was noted.  The rectovaginal septum was then reapproximated with plicating sutures of 2-0 Vicryl.  After placement of the first plication stitch two fingers were inserted into the vaginal to confirm adequate caliber.  The last distal stitch incorporated the perineal body in a U stitch fashion.  After plication, the excess vaginal mucosa was trimmed and the vaginal mucosa was reapproximated using 2-0 Vicryl sutures in a running fashion.  The vagina was copiously irrigated and hemostasis was noted.  Vaginal packing was not placed.  A rectal examination was normal and confirmed no sutures within the rectum.  The patient tolerated the procedure well.  She was awakened from anesthesia and transferred to the recovery room in stable condition. All counts were correct x 2.    Marguerita Beards

## 2023-09-05 NOTE — Anesthesia Preprocedure Evaluation (Addendum)
Anesthesia Evaluation  Patient identified by MRN, date of birth, ID band Patient awake    Reviewed: Allergy & Precautions, NPO status , Patient's Chart, lab work & pertinent test results  Airway Mallampati: II  TM Distance: >3 FB Neck ROM: Full    Dental no notable dental hx.    Pulmonary former smoker   Pulmonary exam normal        Cardiovascular hypertension, Pt. on medications Normal cardiovascular exam     Neuro/Psych  PSYCHIATRIC DISORDERS      negative neurological ROS     GI/Hepatic negative GI ROS, Neg liver ROS,,,  Endo/Other  negative endocrine ROS    Renal/GU negative Renal ROS     Musculoskeletal negative musculoskeletal ROS (+)    Abdominal   Peds  Hematology negative hematology ROS (+)   Anesthesia Other Findings anterior vaginal prolapse posterior vaginal prolapse stress urinary incontinences  Reproductive/Obstetrics Hcg negative                             Anesthesia Physical Anesthesia Plan  ASA: 2  Anesthesia Plan: General   Post-op Pain Management:    Induction: Intravenous  PONV Risk Score and Plan: 4 or greater and Ondansetron, Dexamethasone, Midazolam, Treatment may vary due to age or medical condition and Scopolamine patch - Pre-op  Airway Management Planned: LMA  Additional Equipment:   Intra-op Plan:   Post-operative Plan: Extubation in OR  Informed Consent: I have reviewed the patients History and Physical, chart, labs and discussed the procedure including the risks, benefits and alternatives for the proposed anesthesia with the patient or authorized representative who has indicated his/her understanding and acceptance.     Dental advisory given  Plan Discussed with: CRNA  Anesthesia Plan Comments:        Anesthesia Quick Evaluation

## 2023-09-05 NOTE — Transfer of Care (Signed)
Immediate Anesthesia Transfer of Care Note  Patient: Karen Mosley  Procedure(s) Performed: ANTERIOR (CYSTOCELE) AND POSTERIOR REPAIR (RECTOCELE) (Vagina ) TRANSVAGINAL TAPE (TVT) PROCEDURE (Vagina ) CYSTOSCOPY (Urethra) EXAM UNDER ANESTHESIA (Vagina )  Patient Location: PACU  Anesthesia Type:General  Level of Consciousness: drowsy  Airway & Oxygen Therapy: Patient Spontanous Breathing and Patient connected to nasal cannula oxygen  Post-op Assessment: Report given to RN  Post vital signs: Reviewed and stable  Last Vitals:  Vitals Value Taken Time  BP 118/70   Temp    Pulse 83 09/05/23 1405  Resp 24 09/05/23 1405  SpO2 100 % 09/05/23 1405  Vitals shown include unfiled device data.  Last Pain:  Vitals:   09/05/23 1102  TempSrc: Oral  PainSc: 0-No pain      Patients Stated Pain Goal: 4 (09/05/23 1102)  Complications: No notable events documented.

## 2023-09-05 NOTE — Interval H&P Note (Signed)
History and Physical Interval Note:  09/05/2023 12:21 PM  Karen Mosley  has presented today for surgery, with the diagnosis of anterior vaginal prolapse, posterior vaginal prolapse; stress urinary incontinences.  The various methods of treatment have been discussed with the patient and family. After consideration of risks, benefits and other options for treatment, the patient has consented to  Procedure(s) with comments: ANTERIOR (CYSTOCELE) AND POSTERIOR REPAIR (RECTOCELE) (N/A) - Total time requested is 1.5 hours TRANSVAGINAL TAPE (TVT) PROCEDURE (N/A) CYSTOSCOPY (N/A) as a surgical intervention.   Vitals:   09/05/23 1102  BP: 125/84  Pulse: 96  Resp: 16  Temp: 98.3 F (36.8 C)  SpO2: 100%    Gen: NAD CV: S1 S2 RRR Lungs: Clear to auscultation bilaterally Abd: soft, nontender  The patient's history has been reviewed, patient examined, no change in status, stable for surgery.  I have reviewed the patient's chart and labs.  Questions were answered to the patient's satisfaction.     Marguerita Beards

## 2023-09-05 NOTE — Anesthesia Procedure Notes (Signed)
Procedure Name: LMA Insertion Date/Time: 09/05/2023 12:58 PM  Performed by: Briant Sites, CRNAPre-anesthesia Checklist: Patient identified, Emergency Drugs available, Suction available and Patient being monitored Patient Re-evaluated:Patient Re-evaluated prior to induction Oxygen Delivery Method: Circle system utilized Preoxygenation: Pre-oxygenation with 100% oxygen Induction Type: IV induction Ventilation: Mask ventilation without difficulty LMA: LMA inserted LMA Size: 4.0 Number of attempts: 1 Airway Equipment and Method: Bite block Placement Confirmation: positive ETCO2 Tube secured with: Tape Dental Injury: Teeth and Oropharynx as per pre-operative assessment

## 2023-09-05 NOTE — Discharge Instructions (Addendum)
POST OPERATIVE INSTRUCTIONS  General Instructions Recovery (not bed rest) will last approximately 6 weeks Walking is encouraged, but refrain from strenuous exercise/ housework/ heavy lifting. No lifting >10lbs  Nothing in the vagina- NO intercourse, tampons or douching Bathing:  Do not submerge in water (NO swimming, bath, hot tub, etc) until after your postop visit. You can shower starting the day after surgery.  No driving until you are not taking narcotic pain medicine and until your pain is well enough controlled that you can slam on the breaks or make sudden movements if needed.   Taking your medications Please take your acetaminophen and ibuprofen on a schedule for the first 48 hours. Take 600mg  ibuprofen, then take 500mg  acetaminophen 3 hours later, then continue to alternate ibuprofen and acetaminophen. That way you are taking each type of medication every 6 hours. Take the prescribed narcotic (oxycodone, tramadol, etc) as needed, with a maximum being every 4 hours.  Take a stool softener daily to keep your stools soft and preventing you from straining. If you have diarrhea, you decrease your stool softener. This is explained more below. We have prescribed you Miralax.  Reasons to Call the Nurse (see last page for phone numbers) Heavy Bleeding (changing your pad every 1-2 hours) Persistent nausea/vomiting Fever (100.4 degrees or more) Incision problems (pus or other fluid coming out, redness, warmth, increased pain)  Things to Expect After Surgery Mild to Moderate pain is normal during the first day or two after surgery. If prescribed, take Ibuprofen or Tylenol first and use the stronger medicine for "break-through" pain. You can overlap these medicines because they work differently.   Constipation   To Prevent Constipation:  Eat a well-balanced diet including protein, grains, fresh fruit and vegetables.  Drink plenty of fluids. Walk regularly.  Depending on specific instructions  from your physician: take Miralax daily and additionally you can add a stool softener (colace/ docusate) and fiber supplement. Continue as long as you're on pain medications.   To Treat Constipation:  If you do not have a bowel movement in 2 days after surgery, you can take 2 Tbs of Milk of Magnesia 1-2 times a day until you have a bowel movement. If diarrhea occurs, decrease the amount or stop the laxative. If no results with Milk of Magnesia, you can drink a bottle of magnesium citrate which you can purchase over the counter.  Fatigue:  This is a normal response to surgery and will improve with time.  Plan frequent rest periods throughout the day.  Gas Pain:  This is very common but can also be very painful! Drink warm liquids such as herbal teas, bouillon or soup. Walking will help you pass more gas.  Mylicon or Gas-X can be taken over the counter.  Leaking Urine:  Varying amounts of leakage may occur after surgery.  This should improve with time. Your bladder needs at least 3 months to recover from surgery. If you leak after surgery, be sure to mention this to your doctor at your post-op visit. If you were taking medications for overactive bladder prior to surgery, be sure to restart the medications immediately after surgery.  Incisions: If you have incisions on your abdomen, the skin glue will dissolve on its own over time. It is ok to gently rinse with soap and water over these incisions but do not scrub.  Catheter Approximately 50% of patients are unable to urinate after surgery and need to go home with a catheter. This allows your bladder to  rest so it can return to full function. If you go home with the cathter, you will be given instructions to remove it. For most patients, after a few days, they are able to urinate on their own. Long term catheter use is rare.   Return to Work  As work demands and recovery times vary widely, it is hard to predict when you will want to return to work. If  you have a desk job with no strenuous physical activity, and if you would like to return sooner than generally recommended, discuss this with your provider or call our office.   Post op concerns  For non-emergent issues, please call the Urogynecology Nurse. Please leave a message and someone will contact you within one business day.  You can also send a message through MyChart.   AFTER HOURS (After 5:00 PM and on weekends):  For urgent matters that cannot wait until the next business day. Call our office [phone] and connect to the doctor on call.  Please reserve this for important issues.   **FOR ANY TRUE EMERGENCY ISSUES CALL 911 OR GO TO THE NEAREST EMERGENCY ROOM.** Please inform our office or the doctor on call of any emergency.     APPOINTMENTS: Call [phone]\  No NSAID (Advil, Aleve, or Ibuprofen) until 7:30 p.m.  Post Anesthesia Home Care Instructions  Activity: Get plenty of rest for the remainder of the day. A responsible individual must stay with you for 24 hours following the procedure.  For the next 24 hours, DO NOT: -Drive a car -Advertising copywriter -Drink alcoholic beverages -Take any medication unless instructed by your physician -Make any legal decisions or sign important papers.  Meals: Start with liquid foods such as gelatin or soup. Progress to regular foods as tolerated. Avoid greasy, spicy, heavy foods. If nausea and/or vomiting occur, drink only clear liquids until the nausea and/or vomiting subsides. Call your physician if vomiting continues.  Special Instructions/Symptoms: Your throat may feel dry or sore from the anesthesia or the breathing tube placed in your throat during surgery. If this causes discomfort, gargle with warm salt water. The discomfort should disappear within 24 hours.  If you had a scopolamine patch placed behind your ear for the management of post- operative nausea and/or vomiting:  1. The medication in the patch is effective for  72 hours, after which it should be removed.  Wrap patch in a tissue and discard in the trash. Wash hands thoroughly with soap and water. 2. You may remove the patch earlier than 72 hours if you experience unpleasant side effects which may include dry mouth, dizziness or visual disturbances. 3. Avoid touching the patch. Wash your hands with soap and water after contact with the patch.

## 2023-09-05 NOTE — Anesthesia Postprocedure Evaluation (Signed)
Anesthesia Post Note  Patient: Karen Mosley, Karen Mosley  Procedure(s) Performed: ANTERIOR (CYSTOCELE) AND POSTERIOR REPAIR (RECTOCELE) (Vagina ) TRANSVAGINAL TAPE (TVT) PROCEDURE (Vagina ) CYSTOSCOPY (Urethra) EXAM UNDER ANESTHESIA (Vagina )     Patient location during evaluation: PACU Anesthesia Type: General Level of consciousness: awake Pain management: pain level controlled Vital Signs Assessment: post-procedure vital signs reviewed and stable Respiratory status: spontaneous breathing, nonlabored ventilation and respiratory function stable Cardiovascular status: blood pressure returned to baseline and stable Postop Assessment: no apparent nausea or vomiting Anesthetic complications: no   No notable events documented.  Last Vitals:  Vitals:   09/05/23 1452 09/05/23 1521  BP:  118/75  Pulse: 64 69  Resp: 12 15  Temp:  36.4 C  SpO2: 96% 100%    Last Pain:  Vitals:   09/05/23 1521  TempSrc:   PainSc: 4                  Errika Narvaiz P Tria Noguera

## 2023-09-07 ENCOUNTER — Telehealth: Payer: Self-pay | Admitting: Obstetrics and Gynecology

## 2023-09-07 ENCOUNTER — Encounter (HOSPITAL_BASED_OUTPATIENT_CLINIC_OR_DEPARTMENT_OTHER): Payer: Self-pay | Admitting: Obstetrics and Gynecology

## 2023-09-07 NOTE — Telephone Encounter (Signed)
Karen Mosley underwent Anterior and posterior repair, midurethral sling (Advantage Fit), cystoscopy on 09/05/23.   She passed her voiding trial.  was backfilled into the bladder Voided  PVR by bladder scan was 0ml.   She was discharged without a catheter. Please call her for a routine post op check. Thanks!  Marguerita Beards, MD

## 2023-09-08 NOTE — Telephone Encounter (Signed)
Called and left VM.

## 2023-09-20 ENCOUNTER — Other Ambulatory Visit: Payer: Self-pay | Admitting: Nurse Practitioner

## 2023-09-20 NOTE — Telephone Encounter (Signed)
Attempted to call patient to schedule appointment- left message to call office. Courtesy 30 day Rx given.  Requested Prescriptions  Pending Prescriptions Disp Refills   losartan-hydrochlorothiazide (HYZAAR) 50-12.5 MG tablet [Pharmacy Med Name: LOSARTAN-HCTZ 50-12.5 MG TAB] 30 tablet 0    Sig: TAKE 1 TABLET BY MOUTH EVERY DAY     Cardiovascular: ARB + Diuretic Combos Failed - 09/20/2023  1:29 AM      Failed - eGFR is 10 or above and within 180 days    GFR calc Af Amer  Date Value Ref Range Status  06/12/2018 >60 >60 mL/min Final    Comment:    (NOTE) The eGFR has been calculated using the CKD EPI equation. This calculation has not been validated in all clinical situations. eGFR's persistently <60 mL/min signify possible Chronic Kidney Disease.    GFR, Estimated  Date Value Ref Range Status  04/22/2021 >60 >60 mL/min Final    Comment:    (NOTE) Calculated using the CKD-EPI Creatinine Equation (2021)    eGFR  Date Value Ref Range Status  10/22/2022 79 >59 mL/min/1.73 Final         Failed - Valid encounter within last 6 months    Recent Outpatient Visits           11 months ago Essential hypertension   [place] [facility] [place], Stuart T, NP   1 year ago Essential hypertension   [place] [facility] [place], Partridge T, NP   1 year ago Essential hypertension   [place] [facility] [place], Lauren A, NP   2 years ago Encounter to establish care   [facility] [place], [place] T, NP              Passed - K in normal range and within 180 days    Potassium  Date Value Ref Range Status  09/05/2023 3.9 3.5 - 5.1 mmol/L Final         Passed - Na in normal range and within 180 days    Sodium  Date Value Ref Range Status  09/05/2023 139 135 - 145 mmol/L Final  10/22/2022 137 134 - 144 mmol/L Final         Passed - Cr in normal range and within 180 days    Creatinine, Ser  Date Value Ref Range  Status  09/05/2023 0.80 0.44 - 1.00 mg/dL Final   Creatinine, Urine  Date Value Ref Range Status  06/11/2018 121 mg/dL Final         Passed - Patient is not pregnant      Passed - Last BP in normal range    BP Readings from Last 1 Encounters:  09/05/23 118/75

## 2023-10-05 ENCOUNTER — Encounter: Payer: BC Managed Care – PPO | Admitting: Obstetrics and Gynecology

## 2023-10-06 ENCOUNTER — Encounter: Payer: BC Managed Care – PPO | Admitting: Obstetrics and Gynecology

## 2023-10-21 ENCOUNTER — Other Ambulatory Visit: Payer: Self-pay | Admitting: Nurse Practitioner

## 2023-10-21 NOTE — Telephone Encounter (Signed)
Requested medication (s) are due for refill today: Yes  Requested medication (s) are on the active medication list: Yes  Last refill:  09/20/23  Future visit scheduled: No  Notes to clinic:  Left pt. Message to call and make appointment.    Requested Prescriptions  Pending Prescriptions Disp Refills   losartan-hydrochlorothiazide (HYZAAR) 50-12.5 MG tablet [Pharmacy Med Name: LOSARTAN-HCTZ 50-12.5 MG TAB] 30 tablet 0    Sig: TAKE 1 TABLET BY MOUTH EVERY DAY     Cardiovascular: ARB + Diuretic Combos Failed - 10/21/2023  1:29 AM      Failed - eGFR is 10 or above and within 180 days    GFR calc Af Amer  Date Value Ref Range Status  06/12/2018 >60 >60 mL/min Final    Comment:    (NOTE) The eGFR has been calculated using the CKD EPI equation. This calculation has not been validated in all clinical situations. eGFR's persistently <60 mL/min signify possible Chronic Kidney Disease.    GFR, Estimated  Date Value Ref Range Status  04/22/2021 >60 >60 mL/min Final    Comment:    (NOTE) Calculated using the CKD-EPI Creatinine Equation (2021)    eGFR  Date Value Ref Range Status  10/22/2022 79 >59 mL/min/1.73 Final         Failed - Valid encounter within last 6 months    Recent Outpatient Visits           12 months ago Essential hypertension   [place] [facility] [place], [place] T, NP   1 year ago Essential hypertension   [place] [facility] [place], Clinton T, NP   2 years ago Essential hypertension   [place] [facility] [place], Lauren A, NP   2 years ago Encounter to establish care   [place] [facility] [place], [place]field T, NP              Passed - K in normal range and within 180 days    Potassium  Date Value Ref Range Status  09/05/2023 3.9 3.5 - 5.1 mmol/L Final         Passed - Na in normal range and within 180 days    Sodium  Date Value Ref Range Status  09/05/2023 139 135 - 145 mmol/L  Final  10/22/2022 137 134 - 144 mmol/L Final         Passed - Cr in normal range and within 180 days    Creatinine, Ser  Date Value Ref Range Status  09/05/2023 0.80 0.44 - 1.00 mg/dL Final   Creatinine, Urine  Date Value Ref Range Status  06/11/2018 121 mg/dL Final         Passed - Patient is not pregnant      Passed - Last BP in normal range    BP Readings from Last 1 Encounters:  09/05/23 118/75

## 2023-11-14 ENCOUNTER — Other Ambulatory Visit: Payer: Self-pay | Admitting: Nurse Practitioner

## 2023-11-14 MED ORDER — LOSARTAN POTASSIUM-HCTZ 50-12.5 MG PO TABS: 1.0000 | ORAL_TABLET | Freq: Every day | ORAL | 0 refills | Status: DC

## 2023-12-11 ENCOUNTER — Other Ambulatory Visit: Payer: Self-pay | Admitting: Nurse Practitioner

## 2023-12-13 NOTE — Telephone Encounter (Signed)
 Requested medication (s) are due for refill today - yes  Requested medication (s) are on the active medication list -yes  Future visit scheduled -no  Last refill: 11/14/23 #30  Notes to clinic: last RF courtesy refill- patient has moved out of town- see notes in chart  Requested Prescriptions  Pending Prescriptions Disp Refills   losartan -hydrochlorothiazide (HYZAAR) 50-12.5 MG tablet [Pharmacy Med Name: LOSARTAN -HCTZ 50-12.5 MG TAB] 30 tablet 0    Sig: TAKE 1 TABLET BY MOUTH EVERY DAY     Cardiovascular: ARB + Diuretic Combos Failed - 12/13/2023  3:53 PM      Failed - eGFR is 10 or above and within 180 days    GFR calc Af Amer  Date Value Ref Range Status  06/12/2018 >60 >60 mL/min Final    Comment:    (NOTE) The eGFR has been calculated using the CKD EPI equation. This calculation has not been validated in all clinical situations. eGFR's persistently <60 mL/min signify possible Chronic Kidney Disease.    GFR, Estimated  Date Value Ref Range Status  04/22/2021 >60 >60 mL/min Final    Comment:    (NOTE) Calculated using the CKD-EPI Creatinine Equation (2021)    eGFR  Date Value Ref Range Status  10/22/2022 79 >59 mL/min/1.73 Final         Failed - Valid encounter within last 6 months    Recent Outpatient Visits           1 year ago Essential hypertension   [place] [facility] [place], Henning T, NP   1 year ago Essential hypertension   [place] [facility] [place], [place] T, NP   2 years ago Essential hypertension   [place] [facility] [place], Lauren A, NP   2 years ago Encounter to establish care    [facility] [place], [place] T, NP              Passed - K in normal range and within 180 days    Potassium  Date Value Ref Range Status  09/05/2023 3.9 3.5 - 5.1 mmol/L Final         Passed - Na in normal range and within 180 days    Sodium  Date Value Ref Range Status  09/05/2023  139 135 - 145 mmol/L Final  10/22/2022 137 134 - 144 mmol/L Final         Passed - Cr in normal range and within 180 days    Creatinine, Ser  Date Value Ref Range Status  09/05/2023 0.80 0.44 - 1.00 mg/dL Final   Creatinine, Urine  Date Value Ref Range Status  06/11/2018 121 mg/dL Final         Passed - Patient is not pregnant      Passed - Last BP in normal range    BP Readings from Last 1 Encounters:  09/05/23 118/75            Requested Prescriptions  Pending Prescriptions Disp Refills   losartan -hydrochlorothiazide (HYZAAR) 50-12.5 MG tablet [Pharmacy Med Name: LOSARTAN -HCTZ 50-12.5 MG TAB] 30 tablet 0    Sig: TAKE 1 TABLET BY MOUTH EVERY DAY     Cardiovascular: ARB + Diuretic Combos Failed - 12/13/2023  3:53 PM      Failed - eGFR is 10 or above and within 180 days    GFR calc Af Amer  Date Value Ref Range Status  06/12/2018 >60 >60 mL/min Final    Comment:    (  NOTE) The eGFR has been calculated using the CKD EPI equation. This calculation has not been validated in all clinical situations. eGFR's persistently <60 mL/min signify possible Chronic Kidney Disease.    GFR, Estimated  Date Value Ref Range Status  04/22/2021 >60 >60 mL/min Final    Comment:    (NOTE) Calculated using the CKD-EPI Creatinine Equation (2021)    eGFR  Date Value Ref Range Status  10/22/2022 79 >59 mL/min/1.73 Final         Failed - Valid encounter within last 6 months    Recent Outpatient Visits           1 year ago Essential hypertension   [place] [facility] [place], [place] T, NP   1 year ago Essential hypertension   [place] [facility] [place], [place] T, NP   2 years ago Essential hypertension   [place] [facility] [place], Lauren A, NP   2 years ago Encounter to establish care   [facility] [place], [place] T, NP              Passed - K in normal range and within 180 days    Potassium   Date Value Ref Range Status  09/05/2023 3.9 3.5 - 5.1 mmol/L Final         Passed - Na in normal range and within 180 days    Sodium  Date Value Ref Range Status  09/05/2023 139 135 - 145 mmol/L Final  10/22/2022 137 134 - 144 mmol/L Final         Passed - Cr in normal range and within 180 days    Creatinine, Ser  Date Value Ref Range Status  09/05/2023 0.80 0.44 - 1.00 mg/dL Final   Creatinine, Urine  Date Value Ref Range Status  06/11/2018 121 mg/dL Final         Passed - Patient is not pregnant      Passed - Last BP in normal range    BP Readings from Last 1 Encounters:  09/05/23 118/75

## 2023-12-14 NOTE — Telephone Encounter (Signed)
 The patient has re-established at [facility] Department.

## 2023-12-14 NOTE — Telephone Encounter (Signed)
 Patient is overdue for an appointment. Please call to schedule and then route to provider for refill.

## 2023-12-14 NOTE — Telephone Encounter (Signed)
 Called patient to get her scheduled for an appointment, she stated that she is now going to [facility] Department.

## 2024-01-13 ENCOUNTER — Other Ambulatory Visit: Payer: Self-pay | Admitting: Nurse Practitioner

## 2024-01-13 NOTE — Telephone Encounter (Signed)
 Requested medications are due for refill today.  yes  Requested medications are on the active medications list.  yes  Last refill. 12/14/2023 #30 0 rf  Future visit scheduled.   no  Notes to clinic.  Pt already given a courtesy refill. Labs are expired.    Requested Prescriptions  Pending Prescriptions Disp Refills   losartan -hydrochlorothiazide (HYZAAR) 50-12.5 MG tablet [Pharmacy Med Name: LOSARTAN -HCTZ 50-12.5 MG TAB] 30 tablet 0    Sig: TAKE 1 TABLET BY MOUTH DAILY. LAST COURTESY REFILL,NEEDS APPOINTMENT FOR MORE REFILLS     Cardiovascular: ARB + Diuretic Combos Failed - 01/13/2024  2:19 PM      Failed - eGFR is 10 or above and within 180 days    GFR calc Af Amer  Date Value Ref Range Status  06/12/2018 >60 >60 mL/min Final    Comment:    (NOTE) The eGFR has been calculated using the CKD EPI equation. This calculation has not been validated in all clinical situations. eGFR's persistently <60 mL/min signify possible Chronic Kidney Disease.    GFR, Estimated  Date Value Ref Range Status  04/22/2021 >60 >60 mL/min Final    Comment:    (NOTE) Calculated using the CKD-EPI Creatinine Equation (2021)    eGFR  Date Value Ref Range Status  10/22/2022 79 >59 mL/min/1.73 Final         Failed - Valid encounter within last 6 months    Recent Outpatient Visits           1 year ago Essential hypertension   [place] [facility] [place], Anton T, NP   1 year ago Essential hypertension   [place] [facility] [place], Manele T, NP   2 years ago Essential hypertension   [place] [facility] [place], Lauren A, NP   2 years ago Encounter to establish care   [place] [facility] [place], [place] T, NP              Passed - K in normal range and within 180 days    Potassium  Date Value Ref Range Status  09/05/2023 3.9 3.5 - 5.1 mmol/L Final         Passed - Na in normal range and within 180 days    Sodium   Date Value Ref Range Status  09/05/2023 139 135 - 145 mmol/L Final  10/22/2022 137 134 - 144 mmol/L Final         Passed - Cr in normal range and within 180 days    Creatinine, Ser  Date Value Ref Range Status  09/05/2023 0.80 0.44 - 1.00 mg/dL Final   Creatinine, Urine  Date Value Ref Range Status  06/11/2018 121 mg/dL Final         Passed - Patient is not pregnant      Passed - Last BP in normal range    BP Readings from Last 1 Encounters:  09/05/23 118/75

## 2024-02-20 NOTE — Telephone Encounter (Signed)
 Called patient left message for patient to call office and schedule follow up appt for med refill.
